# Patient Record
Sex: Male | Born: 1985 | Race: White | Hispanic: No | Marital: Married | Smoking: Never smoker
Health system: Southern US, Community
[De-identification: ages and names within clinical notes are randomized; demographics above are authoritative.]

## PROBLEM LIST (undated history)

## (undated) DIAGNOSIS — M549 Dorsalgia, unspecified: Secondary | ICD-10-CM

## (undated) DIAGNOSIS — S43006A Unspecified dislocation of unspecified shoulder joint, initial encounter: Secondary | ICD-10-CM

## (undated) DIAGNOSIS — F329 Major depressive disorder, single episode, unspecified: Secondary | ICD-10-CM

## (undated) DIAGNOSIS — F32A Depression, unspecified: Secondary | ICD-10-CM

## (undated) HISTORY — PX: TONSILLECTOMY: SUR1361

## (undated) HISTORY — DX: Depression, unspecified: F32.A

## (undated) HISTORY — PX: CLAVICLE SURGERY: SHX598

## (undated) HISTORY — DX: Major depressive disorder, single episode, unspecified: F32.9

## (undated) HISTORY — DX: Unspecified dislocation of unspecified shoulder joint, initial encounter: S43.006A

## (undated) HISTORY — DX: Dorsalgia, unspecified: M54.9

---

## 2013-10-14 ENCOUNTER — Emergency Department (HOSPITAL_COMMUNITY): Payer: 59

## 2013-10-14 ENCOUNTER — Emergency Department (HOSPITAL_COMMUNITY)
Admission: EM | Admit: 2013-10-14 | Discharge: 2013-10-14 | Disposition: A | Discharge status: Home / Self Care | Payer: 59 | Attending: Emergency Medicine | Admitting: Emergency Medicine

## 2013-10-14 ENCOUNTER — Encounter (HOSPITAL_COMMUNITY): Payer: Self-pay | Admitting: Emergency Medicine

## 2013-10-14 DIAGNOSIS — Y929 Unspecified place or not applicable: Secondary | ICD-10-CM | POA: Insufficient documentation

## 2013-10-14 DIAGNOSIS — Y9389 Activity, other specified: Secondary | ICD-10-CM | POA: Insufficient documentation

## 2013-10-14 DIAGNOSIS — IMO0002 Reserved for concepts with insufficient information to code with codable children: Secondary | ICD-10-CM | POA: Insufficient documentation

## 2013-10-14 DIAGNOSIS — S199XXA Unspecified injury of neck, initial encounter: Secondary | ICD-10-CM

## 2013-10-14 DIAGNOSIS — S0993XA Unspecified injury of face, initial encounter: Secondary | ICD-10-CM | POA: Insufficient documentation

## 2013-10-14 DIAGNOSIS — S301XXA Contusion of abdominal wall, initial encounter: Secondary | ICD-10-CM | POA: Insufficient documentation

## 2013-10-14 DIAGNOSIS — S0990XA Unspecified injury of head, initial encounter: Secondary | ICD-10-CM | POA: Insufficient documentation

## 2013-10-14 DIAGNOSIS — S43006A Unspecified dislocation of unspecified shoulder joint, initial encounter: Secondary | ICD-10-CM

## 2013-10-14 DIAGNOSIS — S20219A Contusion of unspecified front wall of thorax, initial encounter: Secondary | ICD-10-CM | POA: Insufficient documentation

## 2013-10-14 DIAGNOSIS — T07XXXA Unspecified multiple injuries, initial encounter: Secondary | ICD-10-CM

## 2013-10-14 MED ORDER — PROPOFOL 10 MG/ML IV BOLUS
100.0000 mg | Freq: Once | INTRAVENOUS | Status: DC
  Filled 2013-10-14: qty 20

## 2013-10-14 MED ORDER — KETAMINE HCL 10 MG/ML IJ SOLN
INTRAMUSCULAR | Status: AC | PRN
  Administered 2013-10-14: 10 mg via INTRAVENOUS
  Administered 2013-10-14: 1 mg via INTRAVENOUS
  Administered 2013-10-14 (×3): 10 mg via INTRAVENOUS

## 2013-10-14 MED ORDER — OXYCODONE-ACETAMINOPHEN 5-325 MG PO TABS: 1.0000 | ORAL_TABLET | ORAL | Status: DC | PRN

## 2013-10-14 MED ORDER — KETAMINE HCL 10 MG/ML IJ SOLN: 100.0000 mg | Freq: Once | INTRAMUSCULAR | Status: DC

## 2013-10-14 MED ORDER — IOHEXOL 300 MG/ML  SOLN
100.0000 mL | Freq: Once | INTRAMUSCULAR | Status: AC | PRN
  Administered 2013-10-14: 100 mL via INTRAVENOUS

## 2013-10-14 MED ORDER — PROPOFOL 10 MG/ML IV BOLUS
INTRAVENOUS | Status: AC | PRN
  Administered 2013-10-14: 1 mg via INTRAVENOUS

## 2013-10-14 MED ORDER — HYDROMORPHONE HCL PF 1 MG/ML IJ SOLN
1.0000 mg | Freq: Once | INTRAMUSCULAR | Status: AC | PRN
  Administered 2013-10-14: 1 mg via INTRAVENOUS
  Filled 2013-10-14: qty 1

## 2013-10-14 MED ORDER — METHOCARBAMOL 500 MG PO TABS: 500.0000 mg | ORAL_TABLET | Freq: Two times a day (BID) | ORAL | Status: DC

## 2013-10-14 MED ORDER — ONDANSETRON 8 MG PO TBDP: ORAL_TABLET | ORAL | Status: DC

## 2013-10-14 MED ORDER — ONDANSETRON HCL 4 MG/2ML IJ SOLN
4.0000 mg | Freq: Once | INTRAMUSCULAR | Status: AC
  Administered 2013-10-14: 4 mg via INTRAVENOUS
  Filled 2013-10-14: qty 2

## 2013-10-14 MED ORDER — HYDROMORPHONE HCL PF 1 MG/ML IJ SOLN
1.0000 mg | INTRAMUSCULAR | Status: DC | PRN
  Administered 2013-10-14 (×3): 1 mg via INTRAVENOUS
  Filled 2013-10-14 (×4): qty 1
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
## 2013-10-14 NOTE — ED Notes (Signed)
Patient transported to CT 

## 2013-10-14 NOTE — ED Notes (Signed)
Complains of pain in his back  And left shoulder, red abrasions on left and right anterior chest. Abrasion on left hand.no complains of head hurting.

## 2013-10-14 NOTE — Discharge Instructions (Signed)
1. Medications: percocet, zofran, usual home medications 2. Treatment: rest, drink plenty of fluids, wear sling 3. Follow Up: Please followup with Dr. Dion SaucierLandau for further evaluation and reassessment of your shoulder within 3 days.      Abrasion An abrasion is a cut or scrape of the skin. Abrasions do not extend through all layers of the skin and most heal within 10 days. It is important to care for your abrasion properly to prevent infection. CAUSES  Most abrasions are caused by falling on, or gliding across, the ground or other surface. When your skin rubs on something, the outer and inner layer of skin rubs off, causing an abrasion. DIAGNOSIS  Your caregiver will be able to diagnose an abrasion during a physical exam.  TREATMENT  Your treatment depends on how large and deep the abrasion is. Generally, your abrasion will be cleaned with water and a mild soap to remove any dirt or debris. An antibiotic ointment may be put over the abrasion to prevent an infection. A bandage (dressing) may be wrapped around the abrasion to keep it from getting dirty.  You may need a tetanus shot if:  You cannot remember when you had your last tetanus shot.  You have never had a tetanus shot.  The injury broke your skin. If you get a tetanus shot, your arm may swell, get red, and feel warm to the touch. This is common and not a problem. If you need a tetanus shot and you choose not to have one, there is a rare chance of getting tetanus. Sickness from tetanus can be serious.  HOME CARE INSTRUCTIONS   If a dressing was applied, change it at least once a day or as directed by your caregiver. If the bandage sticks, soak it off with warm water.   Wash the area with water and a mild soap to remove all the ointment 2 times a day. Rinse off the soap and pat the area dry with a clean towel.   Reapply any ointment as directed by your caregiver. This will help prevent infection and keep the bandage from sticking. Use  gauze over the wound and under the dressing to help keep the bandage from sticking.   Change your dressing right away if it becomes wet or dirty.   Only take over-the-counter or prescription medicines for pain, discomfort, or fever as directed by your caregiver.   Follow up with your caregiver within 24 48 hours for a wound check, or as directed. If you were not given a wound-check appointment, look closely at your abrasion for redness, swelling, or pus. These are signs of infection. SEEK IMMEDIATE MEDICAL CARE IF:   You have increasing pain in the wound.   You have redness, swelling, or tenderness around the wound.   You have pus coming from the wound.   You have a fever or persistent symptoms for more than 2 3 days.  You have a fever and your symptoms suddenly get worse.  You have a bad smell coming from the wound or dressing.  MAKE SURE YOU:   Understand these instructions.  Will watch your condition.  Will get help right away if you are not doing well or get worse. Document Released: 03/15/2005 Document Revised: 05/22/2012 Document Reviewed: 05/09/2011 [facility] Patient Information 2014 [place], [place].    Shoulder Dislocation  Shoulder dislocation is when your upper arm bone (humerus) is forced out of your shoulder joint. Your doctor will put your shoulder back into the joint by  pulling on your arm or through surgery. Your arm will be placed in a shoulder immobilizer or sling. The shoulder immobilizer or sling holds your shoulder in place while it heals. HOME CARE   Rest your injured joint. Do not move it until instructed to do so.  Put ice on your injured joint as told by your doctor.  Put ice in a plastic bag.  Place a towel between your skin and the bag.  Leave the ice on for 15-20 minutes at a time, every 2 hours while you are awake.  Only take medicines as told by your doctor.  Squeeze a ball to exercise your hand. GET HELP RIGHT AWAY IF:   Your  splint or sling becomes damaged.  Your pain becomes worse, not better.  You lose feeling in your arm or hand.  Your arm or hand becomes white or cold. MAKE SURE YOU:   Understand these instructions.  Will watch your condition.  Will get help right away if you are not doing well or get worse. Document Released: 08/28/2011 Document Reviewed: 08/28/2011 [facility] Patient Information 2014 [place], [place].

## 2013-10-14 NOTE — Sedation Documentation (Signed)
Patient alert and oriented and verbally. aldrete score
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
## 2013-10-14 NOTE — Sedation Documentation (Signed)
[place], Pharm D, wasted Propofol in [place].

## 2014-04-06 ENCOUNTER — Encounter: Payer: Self-pay | Admitting: Family Medicine

## 2014-04-06 ENCOUNTER — Ambulatory Visit (INDEPENDENT_AMBULATORY_CARE_PROVIDER_SITE_OTHER): Payer: Managed Care, Other (non HMO) | Admitting: Family Medicine

## 2014-04-06 VITALS — BP 102/80 | HR 66 | Temp 97.9°F | Ht 73.0 in | Wt 195.0 lb

## 2014-04-06 DIAGNOSIS — F32A Depression, unspecified: Secondary | ICD-10-CM

## 2014-04-06 DIAGNOSIS — F329 Major depressive disorder, single episode, unspecified: Secondary | ICD-10-CM

## 2014-04-06 MED ORDER — ESCITALOPRAM OXALATE 10 MG PO TABS: 10.0000 mg | ORAL_TABLET | Freq: Every day | ORAL | Status: DC

## 2014-04-06 NOTE — Progress Notes (Signed)
Pre visit review using our clinic review tool, if applicable. No additional management support is needed unless otherwise documented below in the visit note. 

## 2014-04-06 NOTE — Patient Instructions (Signed)
-  start the lexapro 10 mg daily  We recommend the following healthy lifestyle measures: - eat a healthy diet consisting of lots of vegetables, fruits, beans, nuts, seeds, healthy meats such as white chicken and fish and whole grains.  - avoid fried foods, fast food, processed foods, sodas, red meet and other fattening foods.  - get a least 150 minutes of aerobic exercise per week.   Follow up for physical and come fasting that day in 1 month

## 2014-04-06 NOTE — Progress Notes (Signed)
No chief complaint on file.   HPI:  John RamsayJason Campos is here to establish care. Has not seen a doctor in several years.  Has the following chronic problems and concerns today:  There are no active problems to display for this patient.  Depression: -on and off for 10 years -has wellbutrin, SSRIs in the past and they helped -hasn't been on any medications for some time -depressed mood all the time for > 6 months Depression Symptoms: Sleep disorder: sleep too much Interest deficit/anhedonia:  yes Guilt (worthlessness, hopelessness, regret): somewhat Energy deficit: yes Concentration deficit: yes Appetite disorder: no Psychomotor retardation or agitation: yes, sluggish Suicidality:  no Hx of SI in highschool, hospitalized, no attempts at suicide  Takes muscle tech - hydroxy  ROS negative for unless reported above: fevers, unintentional weight loss, hearing or vision loss, chest pain, palpitations, struggling to breath, hemoptysis, melena, hematochezia, hematuria, falls, loc, si, thoughts of self harm  Past Medical History  Diagnosis Date  . Depression   . Back pain   . Dislocated shoulder     Past Surgical History  Procedure Laterality Date  . Clavicle surgery    . Tonsillectomy      Family History  Problem Relation Age of Onset  . Mental illness Mother   . Cancer Maternal Grandmother   . Diabetes Maternal Grandfather     History   Social History  . Marital Status: Married    Spouse Name: N/A    Number of Children: N/A  . Years of Education: N/A   Social History Main Topics  . Smoking status: Never Smoker   . Smokeless tobacco: None  . Alcohol Use: No  . Drug Use: None  . Sexual Activity: None   Other Topics Concern  . None   Social History Narrative   Work or School: Designer, [employer] - product selection      Home Situation: lives with wife      Spiritual Beliefs: Christian      Lifestyle: very active at work, [employer] biking; diet healthy             Current outpatient prescriptions:escitalopram (LEXAPRO) 10 MG tablet, Take 1 tablet (10 mg total) by mouth daily., Disp: 30 tablet, Rfl: 3  EXAM:  Filed Vitals:   04/06/14 1444  BP: 102/80  Pulse: 66  Temp: 97.9 F (36.6 C)    Body mass index is 25.73 kg/(m^2).  GENERAL: vitals reviewed and listed above, alert, oriented, appears well hydrated and in no acute distress  HEENT: atraumatic, conjunttiva clear, no obvious abnormalities on inspection of external nose and ears  NECK: no obvious masses on inspection  LUNGS: clear to auscultation bilaterally, no wheezes, rales or rhonchi, good air movement  CV: HRRR, no peripheral edema  MS: moves all extremities without noticeable abnormality  PSYCH: pleasant and cooperative, no obvious depression or anxiety  ASSESSMENT AND PLAN:  Discussed the following assessment and plan:  Depression - Plan: escitalopram (LEXAPRO) 10 MG tablet  -discussed options and treatment and risks -We reviewed the PMH, PSH, FH, SH, Meds and Allergies. -We provided refills for any medications we will prescribe as needed. -We addressed current concerns per orders and patient instructions. -We have asked for records for pertinent exams, studies, vaccines and notes from previous providers. -We have advised patient to follow up per instructions below -in 1 month for CPE and follow up   -Patient advised to return or notify a doctor immediately if symptoms worsen or persist or new concerns  arise.  Patient Instructions  -start the lexapro 10 mg daily  We recommend the following healthy lifestyle measures: - eat a healthy diet consisting of lots of vegetables, fruits, beans, nuts, seeds, healthy meats such as white chicken and fish and whole grains.  - avoid fried foods, fast food, processed foods, sodas, red meet and other fattening foods.  - get a least 150 minutes of aerobic exercise per week.   Follow up for physical and come fasting that day in  1 month     John Campos R.

## 2014-04-30 ENCOUNTER — Ambulatory Visit (INDEPENDENT_AMBULATORY_CARE_PROVIDER_SITE_OTHER): Payer: Managed Care, Other (non HMO) | Admitting: Family Medicine

## 2014-04-30 ENCOUNTER — Encounter: Payer: Self-pay | Admitting: Family Medicine

## 2014-04-30 VITALS — BP 112/82 | HR 67 | Temp 98.0°F | Ht 72.5 in | Wt 187.6 lb

## 2014-04-30 DIAGNOSIS — Z0000 Encounter for general adult medical examination without abnormal findings: Secondary | ICD-10-CM

## 2014-04-30 DIAGNOSIS — M25522 Pain in left elbow: Secondary | ICD-10-CM

## 2014-04-30 DIAGNOSIS — F329 Major depressive disorder, single episode, unspecified: Secondary | ICD-10-CM

## 2014-04-30 DIAGNOSIS — M79622 Pain in left upper arm: Secondary | ICD-10-CM

## 2014-04-30 DIAGNOSIS — Z23 Encounter for immunization: Secondary | ICD-10-CM

## 2014-04-30 DIAGNOSIS — L989 Disorder of the skin and subcutaneous tissue, unspecified: Secondary | ICD-10-CM

## 2014-04-30 DIAGNOSIS — F32A Depression, unspecified: Secondary | ICD-10-CM

## 2014-04-30 LAB — BASIC METABOLIC PANEL
BUN: 18 mg/dL (ref 6–23)
CHLORIDE: 107 meq/L (ref 96–112)
CO2: 26 meq/L (ref 19–32)
CREATININE: 1 mg/dL (ref 0.4–1.5)
Calcium: 9.6 mg/dL (ref 8.4–10.5)
GFR: 98.99 mL/min (ref >60.00)
Glucose, Bld: 96 mg/dL (ref 70–99)
Potassium: 3.8 mEq/L (ref 3.5–5.1)
Sodium: 140 mEq/L (ref 135–145)

## 2014-04-30 LAB — LIPID PANEL
Cholesterol: 144 mg/dL (ref 0–200)
HDL: 66.2 mg/dL (ref >39.00)
LDL Cholesterol: 70 mg/dL (ref 0–99)
NonHDL: 77.8
Total CHOL/HDL Ratio: 2
Triglycerides: 39 mg/dL (ref 0.0–149.0)
VLDL: 7.8 mg/dL (ref 0.0–40.0)

## 2014-04-30 MED ORDER — ESCITALOPRAM OXALATE 10 MG PO TABS: 10.0000 mg | ORAL_TABLET | Freq: Every day | ORAL | Status: DC

## 2014-04-30 NOTE — Progress Notes (Signed)
Pre visit review using our clinic review tool, if applicable. No additional management support is needed unless otherwise documented below in the visit note. 

## 2014-04-30 NOTE — Addendum Note (Signed)
Addended by: Terressa KoyanagiKIM, Nayda Riesen R on: 04/30/2014 09:36 AM   Modules accepted: Orders

## 2014-04-30 NOTE — Addendum Note (Signed)
Addended by: Johnella MoloneyFUNDERBURK, Felita Bump A on: 04/30/2014 09:44 AM   Modules accepted: Orders

## 2014-04-30 NOTE — Patient Instructions (Signed)
BEFORE YOU LEAVE: -flu shot - have him lie down -labs - have him lie down  -We have ordered labs or studies at this visit. It can take up to 1-2 weeks for results and processing. We will contact you with instructions IF your results are abnormal. Normal results will be released to your [facility]. If you have not heard from us or can not find your results in [facility] in 2 weeks please contact our office.  -PLEASE SIGN UP FOR MYCHART TODAY   We recommend the following healthy lifestyle measures: - eat a healthy diet consisting of lots of vegetables, fruits, beans, nuts, seeds, healthy meats such as white chicken and fish and whole grains.  - avoid fried foods, fast food, processed foods, sodas, red meet and other fattening foods.  - get a least 150 minutes of aerobic exercise per week.

## 2014-04-30 NOTE — Progress Notes (Signed)
HPI:  Here for CPE:  -Concerns and/or follow up today:   Depression: -lexapro 10 mg started 03/2014 -reports: doing great -denies: SI, thoughts of self harm, irritability  L forearm pain: -started 1 month ago after hitting ulnar side of arm -felt better for awhile and then hurt yesterday, ok today -denies:weakness, numbness  -Diet: variety of foods, balanced  -Exercise: regular exercise - very active at work, usually bikes 2-3 times per week too  -Diabetes and Dyslipidemia Screening: doing today - FASTING  -Hx of HTN: no  -Vaccines: flu offered  -sexual activity: yes, male partner, no new partners  -wants STI testing, Hep C screening (if born [id number]): no  -FH colon or prstate ca: see FH Last colon cancer screening: n/a Last prostate ca screening:n/a  -Alcohol, Tobacco, drug use: see social history  Review of Systems - no fevers, unintentional weight loss, vision loss, hearing loss, chest pain, sob, hemoptysis, melena, hematochezia, hematuria, genital discharge, changing or concerning skin lesions, bleeding, bruising, loc, thoughts of self harm or SI  Past Medical History  Diagnosis Date  . Depression   . Back pain   . Dislocated shoulder     Past Surgical History  Procedure Laterality Date  . Clavicle surgery    . Tonsillectomy      Family History  Problem Relation Age of Onset  . Mental illness Mother   . Cancer Maternal Grandmother   . Diabetes Maternal Grandfather     History   Social History  . Marital Status: Married    Spouse Name: N/A    Number of Children: N/A  . Years of Education: N/A   Social History Main Topics  . Smoking status: Never Smoker   . Smokeless tobacco: None  . Alcohol Use: No  . Drug Use: None  . Sexual Activity: None   Other Topics Concern  . None   Social History Narrative   Work or School: Designer, [employer] - product selection      Home Situation: lives with wife      Spiritual Beliefs: Christian      Lifestyle: very active at work, [employer] biking; diet healthy             Current outpatient prescriptions: escitalopram (LEXAPRO) 10 MG tablet, Take 1 tablet (10 mg total) by mouth daily., Disp: 90 tablet, Rfl: 1;  Linoleic Acid-Sunflower Oil (CLA PO), Take by mouth., Disp: , Rfl: ;  Misc Natural Products (ENERGY SUPPORT PO), Take by mouth., Disp: , Rfl: ;  Multiple Vitamin (MULTIVITAMIN) capsule, Take 1 capsule by mouth daily., Disp: , Rfl:   EXAM:  Filed Vitals:   04/30/14 0903  BP: 112/82  Pulse: 67  Temp: 98 F (36.7 C)  TempSrc: Oral  Height: 6' 0.5" (1.842 m)  Weight: 187 lb 9.6 oz (85.095 kg)    Estimated body mass index is 25.08 kg/(m^2) as calculated from the following:   Height as of this encounter: 6' 0.5" (1.842 m).   Weight as of this encounter: 187 lb 9.6 oz (85.095 kg).  GENERAL: vitals reviewed and listed below, alert, oriented, appears well hydrated and in no acute distress  HEENT: head atraumatic, PERRLA, normal appearance of eyes, ears, nose and mouth. moist mucus membranes.  NECK: supple, no masses or lymphadenopathy  LUNGS: clear to auscultation bilaterally, no rales, rhonchi or wheeze  CV: HRRR, no peripheral edema or cyanosis, normal pedal pulses  ABDOMEN: bowel sounds normal, soft, non tender to palpation, no masses, no rebound or guarding  GU: declined  RECTAL: refused  SKIN: no rash or abnormal lesions, slightly raised light reddish brown stuck on mole approx [id number]  MS: normal gait, moves all extremities normally, normal exam of forearms except for mild TTP in soft tissue L forearm focal  NEURO: CN II-XII grossly intact, normal muscle strength and sensation to light touch on extremities  PSYCH: normal affect, pleasant and cooperative  ASSESSMENT AND PLAN:  Discussed the following assessment and plan:  There are no diagnoses linked to this encounter  Visit for preventive health examination -Discussed and advised all [place] preventive  services health task force level A and B recommendations for age, sex and risks.  -Advised at least 150 minutes of exercise per week and a healthy diet low in saturated fats and sweets and consisting of fresh fruits and vegetables, lean meats such as fish and white chicken and whole grains.  -FASTING labs, studies and vaccines per orders this encounter  Depression - Plan: escitalopram (LEXAPRO) 10 MG tablet -continue lexapro, doing great -follow up 3 months -discussed continuing medication for 6-12 months before trial off  Pain in joint, upper arm, left -normal exam, advised to call if persists and will get plain film  Skin lesion -likely SK, discussed low potential for other or serious etiology, but that only way to know for sure is biopsy and offered this, he thinks this is stable and declined  Patient advised to return to clinic immediately if symptoms worsen or persist or new concerns.  Patient Instructions  BEFORE YOU LEAVE: -flu shot - have him lie down -labs - have him lie down  -We have ordered labs or studies at this visit. It can take up to 1-2 weeks for results and processing. We will contact you with instructions IF your results are abnormal. Normal results will be released to your [facility]. If you have not heard from us or can not find your results in [facility] in 2 weeks please contact our office.  -PLEASE SIGN UP FOR MYCHART TODAY   We recommend the following healthy lifestyle measures: - eat a healthy diet consisting of lots of vegetables, fruits, beans, nuts, seeds, healthy meats such as white chicken and fish and whole grains.  - avoid fried foods, fast food, processed foods, sodas, red meet and other fattening foods.  - get a least 150 minutes of aerobic exercise per week.        Return in about 3 months (around 07/31/2014), or if symptoms worsen or fail to improve.   Kriste BasqueKIM, HANNAH R.

## 2014-08-08 ENCOUNTER — Emergency Department (INDEPENDENT_AMBULATORY_CARE_PROVIDER_SITE_OTHER): Payer: Managed Care, Other (non HMO)

## 2014-08-08 ENCOUNTER — Emergency Department (HOSPITAL_COMMUNITY)
Admission: EM | Admit: 2014-08-08 | Discharge: 2014-08-08 | Disposition: A | Discharge status: Home / Self Care | Payer: Managed Care, Other (non HMO) | Source: Home / Self Care

## 2014-08-08 ENCOUNTER — Encounter (HOSPITAL_COMMUNITY): Payer: Self-pay | Admitting: *Deleted

## 2014-08-08 DIAGNOSIS — S93401A Sprain of unspecified ligament of right ankle, initial encounter: Secondary | ICD-10-CM

## 2014-08-08 DIAGNOSIS — M25571 Pain in right ankle and joints of right foot: Secondary | ICD-10-CM

## 2014-08-08 MED ORDER — MELOXICAM 15 MG PO TABS
ORAL_TABLET | ORAL | Status: DC
Start: 2014-08-08 — End: 2014-10-01

## 2014-08-08 NOTE — ED Notes (Signed)
Pt  States   She  Injured  his  r  Ankle  2  Days  Ago    When  He  Became  Angry    And  Kicked  A  Crate   -  He  Has  Pain    On  Certain movements  And  posistions          He  Is  Able  To  Bear  Weight         He    Also states  He      Hit his  Head   On  A  Steel  Beam  He  Did not  [employer]  Out      -    He  Is  Awake  And  Alert  And  Oriented         He   Has  Not  Vomited

## 2014-08-08 NOTE — ED Notes (Signed)
Large    aso  Applied  r  Ankle

## 2014-08-08 NOTE — Discharge Instructions (Signed)
Ankle Sprain °An ankle sprain is an injury to the strong, fibrous tissues (ligaments) that hold the bones of your ankle joint together.  °CAUSES °An ankle sprain is usually caused by a fall or by twisting your ankle. Ankle sprains most commonly occur when you step on the outer edge of your foot, and your ankle turns inward. People who participate in sports are more prone to these types of injuries.  °SYMPTOMS  °· Pain in your ankle. The pain may be present at rest or only when you are trying to stand or walk. °· Swelling. °· Bruising. Bruising may develop immediately or within 1 to 2 days after your injury. °· Difficulty standing or walking, particularly when turning corners or changing directions. °DIAGNOSIS  °Your caregiver will ask you details about your injury and perform a physical exam of your ankle to determine if you have an ankle sprain. During the physical exam, your caregiver will press on and apply pressure to specific areas of your foot and ankle. Your caregiver will try to move your ankle in certain ways. An X-ray exam may be done to be sure a bone was not broken or a ligament did not separate from one of the bones in your ankle (avulsion fracture).  °TREATMENT  °Certain types of braces can help stabilize your ankle. Your caregiver can make a recommendation for this. Your caregiver may recommend the use of medicine for pain. If your sprain is severe, your caregiver may refer you to a surgeon who helps to restore function to parts of your skeletal system (orthopedist) or a physical therapist. °HOME CARE INSTRUCTIONS  °· Apply ice to your injury for 1-2 days or as directed by your caregiver. Applying ice helps to reduce inflammation and pain. °¨ Put ice in a plastic bag. °¨ Place a towel between your skin and the bag. °¨ Leave the ice on for 15-20 minutes at a time, every 2 hours while you are awake. °· Only take over-the-counter or prescription medicines for pain, discomfort, or fever as directed by  your caregiver. °· Elevate your injured ankle above the level of your heart as much as possible for 2-3 days. °· If your caregiver recommends crutches, use them as instructed. Gradually put weight on the affected ankle. Continue to use crutches or a cane until you can walk without feeling pain in your ankle. °· If you have a plaster splint, wear the splint as directed by your caregiver. Do not rest it on anything harder than a pillow for the first 24 hours. Do not put weight on it. Do not get it wet. You may take it off to take a shower or bath. °· You may have been given an elastic bandage to wear around your ankle to provide support. If the elastic bandage is too tight (you have numbness or tingling in your foot or your foot becomes cold and blue), adjust the bandage to make it comfortable. °· If you have an air splint, you may blow more air into it or let air out to make it more comfortable. You may take your splint off at night and before taking a shower or bath. Wiggle your toes in the splint several times per day to decrease swelling. °SEEK MEDICAL CARE IF:  °· You have rapidly increasing bruising or swelling. °· Your toes feel extremely cold or you lose feeling in your foot. °· Your pain is not relieved with medicine. °SEEK IMMEDIATE MEDICAL CARE IF: °· Your toes are numb or blue. °·   You have severe pain that is increasing. MAKE SURE YOU:   Understand these instructions.  Will watch your condition.  Will get help right away if you are not doing well or get worse. Document Released: 06/05/2005 Document Revised: 02/28/2012 Document Reviewed: 06/17/2011 [facility] Patient Information 2015 [place]. This information is not intended to replace advice given to you by your health care provider. Make sure you discuss any questions you have with your health care provider.   Ice for 20min 3x a day with rest. F/U with ORtho if worsens. Info given.

## 2014-08-08 NOTE — ED Provider Notes (Signed)
CSN: [id number]     Arrival date & time 08/08/14  1450 History   None    Chief Complaint  Patient presents with  . Ankle Pain   (Consider location/radiation/quality/duration/timing/severity/associated sxs/prior Treatment) HPI Comments: [name] presents with right ankle pain x 2 days ago. He kicked a box of produce with lower leg and ankle; foot was flexed. He had immediate pain and "stiffness" in the ankle. No swelling. Pain with ambulation. Pain is mostly laterally at times radiates to distal lower leg.  Concerned re: fracture. Not using anything for pain or discomfort.   Patient is a 29 y.o. male presenting with ankle pain. The history is provided by the patient.  Ankle Pain   Past Medical History  Diagnosis Date  . Depression   . Back pain   . Dislocated shoulder    Past Surgical History  Procedure Laterality Date  . Clavicle surgery    . Tonsillectomy     Family History  Problem Relation Age of Onset  . Mental illness Mother   . Cancer Maternal Grandmother   . Diabetes Maternal Grandfather    History  Substance Use Topics  . Smoking status: Never Smoker   . Smokeless tobacco: Not on file  . Alcohol Use: No    Review of Systems  All other systems reviewed and are negative.   Allergies  Codeine  Home Medications   Prior to Admission medications   Medication Sig Start Date End Date Taking? Authorizing Provider  escitalopram (LEXAPRO) 10 MG tablet Take 1 tablet (10 mg total) by mouth daily. 04/30/14   Terressa KoyanagiHannah R Kim, DO  Linoleic Acid-Sunflower Oil (CLA PO) Take by mouth.    Historical Provider, MD  meloxicam (MOBIC) 15 MG tablet 1 tablet po q day for 1-2 weeks for ankle pain/sprain-take with food 08/08/14   Riki SheerMichelle G Tatem Fesler, PA-C  Misc Natural Products (ENERGY SUPPORT PO) Take by mouth.    Historical Provider, MD  Multiple Vitamin (MULTIVITAMIN) capsule Take 1 capsule by mouth daily.    Historical Provider, MD   BP 124/70 mmHg  Pulse 77  Temp(Src) 98.7 F  (37.1 C) (Oral)  Resp 14  SpO2 100% Physical Exam  Constitutional: He is oriented to person, place, and time. He appears well-developed and well-nourished. No distress.  HENT:  Head: Normocephalic and atraumatic.  Musculoskeletal: He exhibits tenderness. He exhibits no edema.  Right lower extremity without deformities or ecchymosis. No swelling is noted. Full ROM with pain upon flexion of the ankle and with lateral rotation. No foot swelling or pain. Good strength in the ankle joint  Neurological: He is alert and oriented to person, place, and time.  Skin: Skin is warm and dry. He is not diaphoretic. No erythema.  Psychiatric: His behavior is normal.  Nursing note and vitals reviewed.   ED Course  Procedures (including critical care time) Labs Review Labs Reviewed - No data to display  Imaging Review Dg Ankle Complete Right  08/08/2014   CLINICAL DATA:  Left ankle pain post injury 2 days ago, kicked box  EXAM: RIGHT ANKLE - COMPLETE 3+ VIEW  COMPARISON:  None.  FINDINGS: Three views of the right ankle submitted. No acute fracture or subluxation. No radiopaque foreign body. Ankle mortise is preserved.  IMPRESSION: Negative.   Electronically Signed   By: Natasha MeadLiviu  Pop M.D.   On: 08/08/2014 17:04     MDM   1. Ankle sprain, right, initial encounter   2. Ankle pain, right  Grade 1 Sprain. Xrays normal. Treat symptomatically with ankle ASO, rest, Ice, NSAID's. Work note given. F/U with Ortho if symptoms persist. Information given.     Riki Sheer, PA-C 08/08/14 [phone]

## 2014-09-18 ENCOUNTER — Ambulatory Visit (INDEPENDENT_AMBULATORY_CARE_PROVIDER_SITE_OTHER): Payer: Managed Care, Other (non HMO) | Admitting: Family Medicine

## 2014-09-18 ENCOUNTER — Encounter: Payer: Self-pay | Admitting: *Deleted

## 2014-09-18 ENCOUNTER — Encounter: Payer: Self-pay | Admitting: Family Medicine

## 2014-09-18 VITALS — BP 120/78 | HR 82 | Temp 98.0°F | Ht 72.5 in | Wt 190.6 lb

## 2014-09-18 DIAGNOSIS — R0982 Postnasal drip: Secondary | ICD-10-CM | POA: Diagnosis not present

## 2014-09-18 DIAGNOSIS — F329 Major depressive disorder, single episode, unspecified: Secondary | ICD-10-CM | POA: Diagnosis not present

## 2014-09-18 DIAGNOSIS — J069 Acute upper respiratory infection, unspecified: Secondary | ICD-10-CM | POA: Diagnosis not present

## 2014-09-18 DIAGNOSIS — F32A Depression, unspecified: Secondary | ICD-10-CM

## 2014-09-18 MED ORDER — FLUTICASONE PROPIONATE 50 MCG/ACT NA SUSP: 2.0000 | Freq: Every day | NASAL | Status: DC

## 2014-09-18 MED ORDER — ESCITALOPRAM OXALATE 10 MG PO TABS: 10.0000 mg | ORAL_TABLET | Freq: Every day | ORAL | Status: DC

## 2014-09-18 NOTE — Patient Instructions (Addendum)
BEFORE YOU LEAVE: -schedule follow up in 1 month -work not for yesterday and today, return to work tomorrow  Chronic nasal congestion: -start claritin (does not make you tired) or zyrtec (can make you tired - so take at night)once daily every day -flonase 2 sprays each nostril daily for 1 month

## 2014-09-18 NOTE — Progress Notes (Signed)
Pre visit review using our clinic review tool, if applicable. No additional management support is needed unless otherwise documented below in the visit note. 

## 2014-09-18 NOTE — Progress Notes (Signed)
HPI:  URI: -started: 3 days ago -symptoms:nasal congestion, sore throat, cough, laryngitis -denies:fever, SOB, NVD, tooth pain -has tried: musinex d and sudafed -sick contacts/travel/risks: denies flu exposure, tick exposure or or Ebola risks  Depression: -reports he is doing well on the lexapro -mild irritable mood at work at times -denies: SI, thoughts of self harm  ROS: See pertinent positives and negatives per HPI.  Past Medical History  Diagnosis Date  . Depression   . Back pain   . Dislocated shoulder     Past Surgical History  Procedure Laterality Date  . Clavicle surgery    . Tonsillectomy      Family History  Problem Relation Age of Onset  . Mental illness Mother   . Cancer Maternal Grandmother   . Diabetes Maternal Grandfather     History   Social History  . Marital Status: Married    Spouse Name: N/A  . Number of Children: N/A  . Years of Education: N/A   Social History Main Topics  . Smoking status: Never Smoker   . Smokeless tobacco: Not on file  . Alcohol Use: No  . Drug Use: Not on file  . Sexual Activity: Not on file   Other Topics Concern  . 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  escitalopram (LEXAPRO) 10 MG tablet, Take 1 tablet (10 mg total) by mouth daily., Disp: 90 tablet, Rfl: 1 .  Linoleic Acid-Sunflower Oil (CLA PO), Take by mouth., Disp: , Rfl:  .  meloxicam (MOBIC) 15 MG tablet, 1 tablet po q day for 1-2 weeks for ankle pain/sprain-take with food, Disp: 30 tablet, Rfl: 0 .  Misc Natural Products (ENERGY SUPPORT PO), Take by mouth., Disp: , Rfl:  .  Multiple Vitamin (MULTIVITAMIN) capsule, Take 1 capsule by mouth daily., Disp: , Rfl:  .  fluticasone (FLONASE) 50 MCG/ACT nasal spray,  Place 2 sprays into both nostrils daily., Disp: 16 g, Rfl: 6  EXAM:  Filed Vitals:   09/18/14 1556  BP: 120/78  Pulse: 82  Temp: 98 F (36.7 C)    Body mass index is 25.48 kg/(m^2).  GENERAL: vitals reviewed and listed above, alert, oriented, appears well hydrated and in no acute distress  HEENT: atraumatic, conjunttiva clear, no obvious abnormalities on inspection of external nose and ears, normal appearance of ear canals and TMs, clear nasal congestion, mild post oropharyngeal erythema with PND, no tonsillar edema or exudate, no sinus TTP  NECK: no obvious masses on inspection  LUNGS: clear to auscultation bilaterally, no wheezes, rales or rhonchi, good air movement  CV: HRRR, no peripheral edema  MS: moves all extremities without noticeable abnormality  PSYCH: pleasant and cooperative, no obvious depression or anxiety  ASSESSMENT AND PLAN:  Discussed the following assessment and plan:  PND (post-nasal drip) - Plan: fluticasone (FLONASE) 50 MCG/ACT nasal spray  Acute upper respiratory infection  Depression - Plan: escitalopram (LEXAPRO) 10 MG tablet  -given HPI and exam findings today, a serious infection or illness is unlikely. We discussed potential etiologies, with VURI being most likely, and advised supportive care and monitoring. We discussed treatment side effects, likely course, antibiotic misuse, transmission,  and signs of developing a serious illness. -discussed causes of PND at length - likely allergies, advised trial of flonase and antihistamine with allergy or ENT eval if persists. -of course, we advised to return or notify a doctor immediately if symptoms worsen or persist or new concerns arise.    Patient Instructions  BEFORE YOU LEAVE: -schedule follow up in 1 month -work not for yesterday and today, return to work tomorrow  Chronic nasal congestion: -start claritin (does not make you tired) or zyrtec (can make you tired - so take at night)once daily  every day -flonase 2 sprays each nostril daily for 1 month       John Campos, John R.

## 2014-10-01 ENCOUNTER — Emergency Department (HOSPITAL_COMMUNITY)
Admission: EM | Admit: 2014-10-01 | Discharge: 2014-10-01 | Disposition: A | Discharge status: Home / Self Care | Payer: Managed Care, Other (non HMO) | Source: Home / Self Care | Attending: Family Medicine | Admitting: Family Medicine

## 2014-10-01 ENCOUNTER — Emergency Department (INDEPENDENT_AMBULATORY_CARE_PROVIDER_SITE_OTHER): Payer: Managed Care, Other (non HMO)

## 2014-10-01 ENCOUNTER — Encounter (HOSPITAL_COMMUNITY): Payer: Self-pay | Admitting: Emergency Medicine

## 2014-10-01 DIAGNOSIS — M2021 Hallux rigidus, right foot: Secondary | ICD-10-CM | POA: Diagnosis not present

## 2014-10-01 DIAGNOSIS — S93529A Sprain of metatarsophalangeal joint of unspecified toe(s), initial encounter: Secondary | ICD-10-CM

## 2014-10-01 MED ORDER — MELOXICAM 15 MG PO TABS: ORAL_TABLET | ORAL | Status: DC

## 2014-10-01 NOTE — ED Notes (Signed)
Right great toe pain, onset 3 days ago

## 2014-10-01 NOTE — Discharge Instructions (Signed)
Thank you for coming in today.   Hallux Rigidus Hallux rigidus is a condition involving pain and a loss of motion of the first (big) toe. The pain gets worse with lifting up (extension) of the toe. This is usually due to arthritic bony bumps (spurring) of the joint at the base of the big toe.  SYMPTOMS   Pain, with lifting up of the toe.  Tenderness over the joint where the big toe meets the foot.  Redness, swelling, and warmth over the top of the base of the big toe (sometimes).  Foot pain, stiffness, and limping. CAUSES  Hallux rigidus is caused by arthritis of the joint where the big toe meets the foot. The arthritis creates a bone spur that pinches the soft tissues when the toe is extended. RISK INCREASES WITH:  Tight shoes with a narrow toe box.  Family history of foot problems.  Gout and rheumatoid and psoriatic arthritis.  History of previous toe injury, including "turf toe."  Long first toe, flat feet, and other big toe bony bumps.  Arthritis of the big toe. PREVENTION   Wear wide-toed shoes that fit well.  Tape the big toe to reduce motion and to prevent pinching of the tissues between the bone.  Maintain physical fitness:  Foot and ankle flexibility.  Muscle strength and endurance. PROGNOSIS  This condition can usually be managed with proper treatment. However, surgery is typically required to prevent the problem from recurring.  RELATED COMPLICATIONS  Injury to other areas of the foot or ankle, caused by abnormal walking in an attempt to avoid the pain felt when walking normally. TREATMENT Treatment first involves stopping the activities that aggravate your symptoms. Ice and medicine can be used to reduce the pain and inflammation. Modifications to shoes may help reduce pain, including wearing stiff-soled shoes, shoes with a wide toe box, inserting a padded donut to relieve pressure on top of the joint, or wearing an arch support. Corticosteroid injections may  be given to reduce inflammation. If nonsurgical treatment is unsuccessful, surgery may be needed. Surgical options include removing the arthritic bony spur, cutting a bone in the foot to change the arc of motion (allowing the toe to extend more), or fusion of the joint (eliminating all motion in the joint at the base of the big toe).  MEDICATION   If pain medicine is needed, nonsteroidal anti-inflammatory medicines (aspirin and ibuprofen), or other minor pain relievers (acetaminophen), are often advised.  Do not take pain medicine for 7 days before surgery.  Prescription pain relievers are usually prescribed only after surgery. Use only as directed and only as much as you need.  Ointments for arthritis, applied to the skin, may give some relief.  Injections of corticosteroids may be given to reduce inflammation. HEAT AND COLD  Cold treatment (icing) relieves pain and reduces inflammation. Cold treatment should be applied for 10 to 15 minutes every 2 to 3 hours, and immediately after activity that aggravates your symptoms. Use ice packs or an ice massage.  Heat treatment may be used before performing the stretching and strengthening activities prescribed by your caregiver, physical therapist, or athletic trainer. Use a heat pack or a warm water soak. SEEK MEDICAL CARE IF:   Symptoms get worse or do not improve in 2 weeks, despite treatment.  After surgery you develop fever, increasing pain, redness, swelling, drainage of fluids, bleeding, or increasing warmth.  New, unexplained symptoms develop. (Drugs used in treatment may produce side effects.) Document Released: 06/05/2005 Document  Revised: 10/20/2013 Document Reviewed: 09/17/2008 ExitCare Patient Information 2015 [place], [place]. This information is not intended to replace advice given to you by your health care provider. Make sure you discuss any questions you have with your health care provider.  Turf Toe, with Rehab Injury to the  base of the big toe (first metatarsal phalangeal joint) that causes damage to the joint capsule and ligaments is known as turf toe. Turf toe commonly occurs on the bottom side of the joint. SYMPTOMS   Pain, tenderness, inflammation and/or bruising around the big toe (contusion).  Pain that worsens with movement of the big toe, specifically when raising (extending) the toe.  Inability to walk properly on the affected foot, which causes one to limp. CAUSES  Turf toe is caused by a force being placed on the joint capsule and ligaments that is greater than they can withstand. Common mechanisms of injury include:  Repetitive and/or strenuous extension of the big toe (standing on tiptoes).  Explosive running starts (sprinters).  "Stubbing" the big toe.  Another player landing on your foot. RISK INCREASES WITH:  Previous toe injury.  Having a long first toe.  Flat feet.  Arthritis of the great toe.  Improperly fitted shoes or shoes that are not appropriate for a given activity.  Family history of foot abnormalities.  Activities that involve explosive running starts, standing on tiptoes, or jumping. PREVENTION  Wear properly fitted shoes that are appropriate for the sport or activity.  Protect the first toe by taping it to reduce motion.  Maintain physical fitness:  Strength, flexibility, and endurance.  Cardiovascular fitness. PROGNOSIS  If treated properly, the symptoms of turf toe usually resolve with non-surgical (conservative) treatment. Occasionally, surgery is necessary. RELATED COMPLICATIONS  Recurrent symptoms that result in a chronic problem.  Inability to compete in athletics.  Prolonged healing time, if improperly treated or re-injured.  Other foot injuries that occur due to protecting the first toe from pain.  Loss of motion in the first toe (hallux rigidus).  Bunion (hallux valgus). TREATMENT  Treatment initially involves resting from any activities  that aggravate the symptoms, and the use of ice and medications to help reduce pain and inflammation. The use of range-of-motion exercises may help reduce pain with activity. It is important that you wear properly fitted shoes with a stiff sole and a wide toe box, in order to reduce the pressure on the first toe. Protecting your big toe by taping it to restrict movement may allow you to return to sports earlier without pain or discomfort. If the condition becomes chronic, then your caregiver may recommend a corticosteroid injection to help reduce inflammation. If symptoms persist despite non-surgical treatment, then surgery may be recommended. MEDICATION  If pain medication is necessary, then nonsteroidal anti-inflammatory medications, such as aspirin and ibuprofen, or other minor pain relievers, such as acetaminophen, are often recommended.  Do not take pain medication for 7 days before surgery.  Prescription pain relievers may be given if deemed necessary by your caregiver. Use only as directed and only as much as you need.  Ointments applied to the skin may be helpful.  Corticosteroid injections may be given by your caregiver. These injections should be reserved for the most serious cases, because they may only be given a certain number of times. HEAT AND COLD  Cold treatment (icing) relieves pain and reduces inflammation. Cold treatment should be applied for 10 to 15 minutes every 2 to 3 hours for inflammation and pain and immediately after  any activity that aggravates your symptoms. Use ice packs or massage the area with a piece of ice (ice massage).  Heat treatment may be used prior to performing the stretching and strengthening activities prescribed by your caregiver, physical therapist, or athletic trainer. Use a heat pack or soak the injury in warm water. SEEK MEDICAL CARE IF:  Treatment seems to offer no benefit, or the condition worsens.  Any medications produce adverse side  effects. EXERCISES RANGE OF MOTION (ROM) AND STRETCHING EXERCISES - Turf Toe These exercises may help you when beginning to rehabilitate your injury. Your symptoms may resolve with or without further involvement from your physician, physical therapist, or athletic trainer. While completing these exercises, remember:  Restoring tissue flexibility helps normal motion to return to the joints. This allows healthier, less painful movement and activity.  An effective stretch should be held for at least 30 seconds.  A stretch should never be painful. You should only feel a gentle lengthening or release in the stretched tissue. RANGE OF MOTION - Toe Extension, Flexion  Sit with your right / left leg crossed over your opposite knee.  Grasp your toes and gently pull them back toward the top of your foot. You should feel a stretch on the bottom of your toes and/or foot.  Hold this stretch for __________ seconds.  Now, gently pull your toes toward the bottom of your foot. You should feel a stretch on the top of your toes and or foot.  Hold this stretch for __________ seconds. Repeat __________ times. Complete this stretch __________ times per day. RANGE OF MOTION - Ankle Plantar Flexion  Sit with your right / left leg crossed over your opposite knee.  Use your opposite hand to pull the top of your foot and toes toward you.  You should feel a gentle stretch on the top of your foot/ankle. Hold this position for __________ seconds. Repeat __________ times. Complete __________ times per day. STRENGTHENING EXERCISES - Turf Toe These exercises may help you when beginning to rehabilitate your injury. They may resolve your symptoms with or without further involvement from your physician, physical therapist, or athletic trainer. While completing these exercises, remember:  Muscles can gain both the endurance and the strength needed for everyday activities through controlled exercises.  Complete these  exercises as instructed by your physician, physical therapist, or athletic trainer. Progress with the resistance and repetition exercises only as your caregiver advises.  You may experience muscle soreness or fatigue, but the pain or discomfort you are trying to eliminate should never worsen during these exercises. If this pain does worsen, stop and make certain you are following the directions exactly. If the pain is still present after adjustments, discontinue the exercise until you can discuss the trouble with your clinician. STRENGTH - Towel Curls  Sit in a chair positioned on a non-carpeted surface.  Place your foot on a towel, keeping your heel on the floor.  Pull the towel toward your heel by only curling your toes. Keep your heel on the floor.  If instructed by your physician, physical therapist or athletic trainer, add ____________________ at the end of the towel. Repeat __________ times. Complete this exercise __________ times per day. Document Released: 06/05/2005 Document Revised: 10/20/2013 Document Reviewed: 09/17/2008 [facility] Patient Information 2015 [place], [place]. This information is not intended to replace advice given to you by your health care provider. Make sure you discuss any questions you have with your health care provider.

## 2014-10-01 NOTE — ED Provider Notes (Signed)
John RamsayJason Campos is a 29 y.o. male who presents to Urgent Care today for right great toe pain. Patient has a history of chronic right toe irritation. Fundamentally he has decreased range of motion of his right toe causes some discomfort. He was doing pre-well in his normal state of health until 2 days ago when he slipped and fell at work injuring his right toe. He notes pain along the lateral aspect of his right first MTP. He's tried some Motrin which have helped a little for pain.    Past Medical History  Diagnosis Date  . Depression   . Back pain   . Dislocated shoulder    Past Surgical History  Procedure Laterality Date  . Clavicle surgery    . Tonsillectomy     History  Substance Use Topics  . Smoking status: Never Smoker   . Smokeless tobacco: Not on file  . Alcohol Use: No   ROS as above Medications: No current facility-administered medications for this encounter.   Current Outpatient Prescriptions  Medication Sig Dispense Refill  . escitalopram (LEXAPRO) 10 MG tablet Take 1 tablet (10 mg total) by mouth daily. 90 tablet 1  . fluticasone (FLONASE) 50 MCG/ACT nasal spray Place 2 sprays into both nostrils daily. 16 g 6  . Linoleic Acid-Sunflower Oil (CLA PO) Take by mouth.    . meloxicam (MOBIC) 15 MG tablet 1 tablet po q day for 1-2 weeks for ankle pain/sprain-take with food 30 tablet 0  . Misc Natural Products (ENERGY SUPPORT PO) Take by mouth.    . Multiple Vitamin (MULTIVITAMIN) capsule Take 1 capsule by mouth daily.     Allergies  Allergen Reactions  . Codeine Rash     Exam:  BP 124/68 mmHg  Pulse 62  Temp(Src) 97.9 F (36.6 C) (Oral)  Resp 16  SpO2 98% Gen: Well NAD Right great toe decreased flexion and extension range of motion compared to left. Mildly tender lateral aspect of the MTP. Pulses capillary refill and sensation intact foot.  No results found for this or any previous visit (from the past 24 hour(s)). Dg Foot Complete Right  10/01/2014   CLINICAL  DATA:  First MTP pain in injury.  EXAM: RIGHT FOOT COMPLETE - 3+ VIEW  COMPARISON:  None.  FINDINGS: No acute fracture or dislocation.  No acute soft tissue findings.  First MTP osteoarthritis with joint narrowing, marginal spurring and chronic medial fragmentation/ossification. No erosive changes.  IMPRESSION: 1. No acute osseous findings. 2. First MTP osteoarthritis.   Electronically Signed   By: Marnee SpringJonathon  Watts M.D.   On: 10/01/2014 21:01    Assessment and Plan: 29 y.o. male with  1) turf toe injury at work. Treat with low back and steel insert as needed. 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 Follow up with sports medicine for consideration of orthotics  Discussed warning signs or symptoms. Please see discharge instructions. Patient expresses understanding.     Rodolph BongEvan S Yoselin Amerman, MD 10/01/14 2110

## 2014-10-07 NOTE — ED Notes (Signed)
Patient called, asking for limited work note . Provided note x 1 week w advise to make an appointment to see ortho next week for recheck

## 2014-10-19 ENCOUNTER — Ambulatory Visit (INDEPENDENT_AMBULATORY_CARE_PROVIDER_SITE_OTHER): Payer: Managed Care, Other (non HMO) | Admitting: Family Medicine

## 2014-10-19 ENCOUNTER — Encounter: Payer: Self-pay | Admitting: Family Medicine

## 2014-10-19 VITALS — BP 118/80 | HR 60 | Temp 98.3°F | Ht 72.5 in | Wt 202.1 lb

## 2014-10-19 DIAGNOSIS — J329 Chronic sinusitis, unspecified: Secondary | ICD-10-CM | POA: Diagnosis not present

## 2014-10-19 NOTE — Progress Notes (Signed)
Pre visit review using our clinic review tool, if applicable. No additional management support is needed unless otherwise documented below in the visit note. 

## 2014-10-19 NOTE — Progress Notes (Signed)
  HPI:  Follow up:  PND: -trail antihistamine and flonase started 1 month ago -reports: he changed jobs and is doing much better -denies: SOB, wheezing, cough, fevers  ROS: See pertinent positives and negatives per HPI.  Past Medical History  Diagnosis Date  . Depression   . Back pain   . Dislocated shoulder     Past Surgical History  Procedure Laterality Date  . Clavicle surgery    . Tonsillectomy      Family History  Problem Relation Age of Onset  . Mental illness Mother   . Cancer Maternal Grandmother   . Diabetes Maternal Grandfather     History   Social History  . Marital Status: Married    Spouse Name: N/A  . Number of Children: N/A  . Years of Education: N/A   Social History Main Topics  . Smoking status: Never Smoker   . Smokeless tobacco: Not on file  . Alcohol Use: No  . Drug Use: Not on file  . Sexual Activity: Not on file   Other Topics Concern  . 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  escitalopram (LEXAPRO) 10 MG tablet, Take 1 tablet (10 mg total) by mouth daily., Disp: 90 tablet, Rfl: 1 .  fluticasone (FLONASE) 50 MCG/ACT nasal spray, Place 2 sprays into both nostrils daily., Disp: 16 g, Rfl: 6 .  Linoleic Acid-Sunflower Oil (CLA PO), Take by mouth., Disp: , Rfl:  .  meloxicam (MOBIC) 15 MG tablet, 1 tablet po q day for 1-2 weeks for ankle pain/sprain-take with food, Disp: 30 tablet, Rfl: 0 .  Misc Natural Products (ENERGY SUPPORT PO), Take by mouth., Disp: , Rfl:  .  Multiple Vitamin (MULTIVITAMIN) capsule, Take 1 capsule by mouth daily., Disp: , Rfl:   EXAM:  Filed Vitals:   10/19/14 1603  BP: 118/80  Pulse: 60  Temp: 98.3 F (36.8 C)    Body mass index is 27.02 kg/(m^2).  GENERAL: vitals  reviewed and listed above, alert, oriented, appears well hydrated and in no acute distress  HEENT: atraumatic, conjunttiva clear, no obvious abnormalities on inspection of external nose and ears  NECK: no obvious masses on inspection  LUNGS: clear to auscultation bilaterally, no wheezes, rales or rhonchi, good air movement  CV: HRRR, no peripheral edema  MS: moves all extremities without noticeable abnormality  PSYCH: pleasant and cooperative, no obvious depression or anxiety  ASSESSMENT AND PLAN:  Discussed the following assessment and plan:  Rhinosinusitis  -much improved -Patient advised to return or notify a doctor immediately if symptoms worsen or persist or new concerns arise.  There are no Patient Instructions on file for this visit.   Kriste BasqueKIM, Maurice Ramseur R.

## 2017-03-08 ENCOUNTER — Encounter: Payer: Self-pay | Admitting: Family Medicine

## 2017-06-28 ENCOUNTER — Encounter: Payer: Self-pay | Admitting: Family Medicine

## 2020-01-15 ENCOUNTER — Encounter (HOSPITAL_COMMUNITY): Payer: Self-pay

## 2020-01-15 ENCOUNTER — Other Ambulatory Visit: Payer: Self-pay

## 2020-01-15 ENCOUNTER — Ambulatory Visit (HOSPITAL_COMMUNITY)
Admission: EM | Admit: 2020-01-15 | Discharge: 2020-01-15 | Disposition: A | Discharge status: Home / Self Care | Payer: No Typology Code available for payment source | Attending: Emergency Medicine | Admitting: Emergency Medicine

## 2020-01-15 ENCOUNTER — Ambulatory Visit (INDEPENDENT_AMBULATORY_CARE_PROVIDER_SITE_OTHER): Payer: No Typology Code available for payment source

## 2020-01-15 DIAGNOSIS — M25512 Pain in left shoulder: Secondary | ICD-10-CM

## 2020-01-15 MED ORDER — NAPROXEN 500 MG PO TABS: 500.0000 mg | ORAL_TABLET | Freq: Two times a day (BID) | ORAL | 0 refills | Status: DC

## 2020-01-15 NOTE — Discharge Instructions (Signed)
Xray normal, no dislocation or fracture, stable from previous xrays We will treat as a shoulder strain and monitor for gradual resolution of symptoms Take Naprosyn twice daily with food Gentle range of motion Ice Follow up with sports medicine if persisting

## 2020-01-15 NOTE — ED Triage Notes (Signed)
Pt presents with shoulder injury from trying to pull a seat down from a ruck at work today.

## 2020-01-16 NOTE — ED Provider Notes (Signed)
[facility]    CSN: [id number] Arrival date & time: 01/15/20  1810      History   Chief Complaint Chief Complaint  Patient presents with   Shoulder Injury    HPI John Campos is a 34 y.o. male presenting today for evaluation of shoulder injury.  Patient reports earlier today he was trying to pull down the seat in a truck while at work.  Felt a pulling sensation in his shoulder and since has had pain with certain movements.  He denies difficulty doing over the head movements and has full range of motion but feels the pain that is sharp deep within his joint.  Has had prior clavicle fracture requiring surgical repair as well as reports prior dislocations of shoulder.  Injury happened a few hours ago.  Denies paresthesias, but does occasionally have pain shooting to bicep of the left side.  HPI  Past Medical History:  Diagnosis Date   Back pain    Depression    Dislocated shoulder     There are no problems to display for this patient.   Past Surgical History:  Procedure Laterality Date   CLAVICLE SURGERY     TONSILLECTOMY         Home Medications    Prior to Admission medications   Medication Sig Start Date End Date Taking? Authorizing Provider  fluticasone (FLONASE) 50 MCG/ACT nasal spray Place 2 sprays into both nostrils daily. 09/18/14   Terressa Koyanagi, DO  Linoleic Acid-Sunflower Oil (CLA PO) Take by mouth.    [provider]  Misc Natural Products (ENERGY SUPPORT PO) Take by mouth.    [provider]  Multiple Vitamin (MULTIVITAMIN) capsule Take 1 capsule by mouth daily.    [provider]  naproxen (NAPROSYN) 500 MG tablet Take 1 tablet (500 mg total) by mouth 2 (two) times daily. 01/15/20   Jaidy Cottam C, PA-C  escitalopram (LEXAPRO) 10 MG tablet Take 1 tablet (10 mg total) by mouth daily. 09/18/14 01/15/20  Terressa Koyanagi, DO    Family History Family History  Problem Relation Age of Onset   Mental illness Mother      Cancer Maternal Grandmother    Diabetes Maternal Grandfather     Social History Social History   Tobacco Use   Smoking status: Never Smoker  Substance Use Topics   Alcohol use: No   Drug use: Not on file     Allergies   Codeine   Review of Systems Review of Systems  Constitutional: Negative for fatigue and fever.  Eyes: Negative for redness, itching and visual disturbance.  Respiratory: Negative for shortness of breath.   Cardiovascular: Negative for chest pain and leg swelling.  Gastrointestinal: Negative for nausea and vomiting.  Musculoskeletal: Positive for arthralgias and myalgias.  Skin: Negative for color change, rash and wound.  Neurological: Negative for dizziness, syncope, weakness, light-headedness and headaches.     Physical Exam Triage Vital Signs ED Triage Vitals  Enc Vitals Group     BP 01/15/20 1904 (!) 156/136     Pulse Rate 01/15/20 1904 71     Resp 01/15/20 1904 18     Temp 01/15/20 1904 98.4 F (36.9 C)     Temp src --      SpO2 01/15/20 1904 98 %     Weight --      Height --      Head Circumference --      Peak Flow --  Pain Score 01/15/20 1901 5     Pain Loc --      Pain Edu? --      Excl. in GC? --    No data found.  Updated Vital Signs BP (!) 156/136 (BP Location: Right Arm)    Pulse 71    Temp 98.4 F (36.9 C)    Resp 18    SpO2 98%   Visual Acuity Right Eye Distance:   Left Eye Distance:   Bilateral Distance:    Right Eye Near:   Left Eye Near:    Bilateral Near:     Physical Exam Vitals and nursing note reviewed.  Constitutional:      Appearance: He is well-developed.     Comments: No acute distress  HENT:     Head: Normocephalic and atraumatic.     Nose: Nose normal.  Eyes:     Conjunctiva/sclera: Conjunctivae normal.  Cardiovascular:     Rate and Rhythm: Normal rate.  Pulmonary:     Effort: Pulmonary effort is normal. No respiratory distress.  Abdominal:     General: There is no distension.   Musculoskeletal:        General: Normal range of motion.     Cervical back: Neck supple.     Comments: Left shoulder: No obvious deformity or discoloration, nontender to palpation along length of clavicle, AC joint or scapular spine, nontender to palpation around periscapular musculature or anterior chest, mild tenderness to palpation over anterior shoulder extending into bicep Full active range of motion of shoulder in all directions Grip strength 5/5 ankle bilaterally, radial pulse 2+  Skin:    General: Skin is warm and dry.  Neurological:     Mental Status: He is alert and oriented to person, place, and time.      UC Treatments / Results  Labs (all labs ordered are listed, but only abnormal results are displayed) Labs Reviewed - No data to display  EKG   Radiology DG Shoulder Left  Result Date: 01/15/2020 CLINICAL DATA:  Recent pulling injury with shoulder pain, initial encounter EXAM: LEFT SHOULDER - 2+ VIEW COMPARISON:  10/14/2013 FINDINGS: Changes of prior surgical repair of a left clavicular fracture are again noted. Humeral head is well seated. Bone island is noted within the glenoid. No fracture is seen. No soft tissue abnormality is noted. IMPRESSION: Postsurgical changes in the left clavicle. No acute abnormality noted. Electronically Signed   By: Alcide Clever M.D.   On: 01/15/2020 20:01    Procedures Procedures (including critical care time)  Medications Ordered in UC Medications - No data to display  Initial Impression / Assessment and Plan / UC Course  I have reviewed the triage vital signs and the nursing notes.  Pertinent labs & imaging results that were available during my care of the patient were reviewed by me and considered in my medical decision making (see chart for details).     X-ray negative for fracture, stable from prior imaging.  Do not suspect rotator cuff injury at this time and patient has full movement of right shoulder, will treat as sprain  and recommend anti-inflammatories with close monitoring.  Follow-up with sports medicine if symptoms persisting.  Discussed strict return precautions. Patient verbalized understanding and is agreeable with plan.  Final Clinical Impressions(s) / UC Diagnoses   Final diagnoses:  Acute pain of left shoulder     Discharge Instructions     Xray normal, no dislocation or fracture, stable from previous xrays We  will treat as a shoulder strain and monitor for gradual resolution of symptoms Take Naprosyn twice daily with food Gentle range of motion Ice Follow up with sports medicine if persisting   ED Prescriptions    Medication Sig Dispense Auth. Provider   naproxen (NAPROSYN) 500 MG tablet Take 1 tablet (500 mg total) by mouth 2 (two) times daily. 30 tablet Marien Manship, [place] C, PA-C     PDMP not reviewed this encounter.   Lew Dawes, [place] 01/16/20 1212

## 2020-09-12 ENCOUNTER — Emergency Department (HOSPITAL_BASED_OUTPATIENT_CLINIC_OR_DEPARTMENT_OTHER)
Admission: EM | Admit: 2020-09-12 | Discharge: 2020-09-12 | Disposition: A | Discharge status: Home / Self Care | Payer: Medicaid Other | Attending: Emergency Medicine | Admitting: Emergency Medicine

## 2020-09-12 ENCOUNTER — Other Ambulatory Visit: Payer: Self-pay

## 2020-09-12 DIAGNOSIS — E860 Dehydration: Secondary | ICD-10-CM | POA: Insufficient documentation

## 2020-09-12 DIAGNOSIS — R000 Tachycardia, unspecified: Secondary | ICD-10-CM | POA: Insufficient documentation

## 2020-09-12 DIAGNOSIS — R197 Diarrhea, unspecified: Secondary | ICD-10-CM | POA: Insufficient documentation

## 2020-09-12 LAB — COMPREHENSIVE METABOLIC PANEL
ALT: 65 U/L — ABNORMAL HIGH (ref 0–44)
AST: 33 U/L (ref 15–41)
Albumin: 4.9 g/dL (ref 3.5–5.0)
Alkaline Phosphatase: 55 U/L (ref 38–126)
Anion gap: 10 (ref 5–15)
BUN: 25 mg/dL — ABNORMAL HIGH (ref 6–20)
CO2: 17 mmol/L — ABNORMAL LOW (ref 22–32)
Calcium: 9.6 mg/dL (ref 8.9–10.3)
Chloride: 109 mmol/L (ref 98–111)
Creatinine, Ser: 1.1 mg/dL (ref 0.61–1.24)
GFR, Estimated: >60 mL/min (ref >60)
Glucose, Bld: 104 mg/dL — ABNORMAL HIGH (ref 70–99)
Potassium: 3.9 mmol/L (ref 3.5–5.1)
Sodium: 136 mmol/L (ref 135–145)
Total Bilirubin: 0.6 mg/dL (ref 0.3–1.2)
Total Protein: 7.3 g/dL (ref 6.5–8.1)

## 2020-09-12 LAB — URINALYSIS, ROUTINE W REFLEX MICROSCOPIC
Bilirubin Urine: NEGATIVE
Glucose, UA: NEGATIVE mg/dL
Hgb urine dipstick: NEGATIVE
Ketones, ur: NEGATIVE mg/dL
Leukocytes,Ua: NEGATIVE
Nitrite: NEGATIVE
Protein, ur: NEGATIVE mg/dL
Specific Gravity, Urine: 1.021 (ref 1.005–1.030)
pH: 5.5 (ref 5.0–8.0)

## 2020-09-12 LAB — CBC WITH DIFFERENTIAL/PLATELET
Abs Immature Granulocytes: 0.02 10*3/uL (ref 0.00–0.07)
Basophils Absolute: 0 10*3/uL (ref 0.0–0.1)
Basophils Relative: 0 %
Eosinophils Absolute: 0.1 10*3/uL (ref 0.0–0.5)
Eosinophils Relative: 1 %
HCT: 41.5 % (ref 39.0–52.0)
Hemoglobin: 14.2 g/dL (ref 13.0–17.0)
Immature Granulocytes: 0 %
Lymphocytes Relative: 3 %
Lymphs Abs: 0.3 10*3/uL — ABNORMAL LOW (ref 0.7–4.0)
MCH: 30.3 pg (ref 26.0–34.0)
MCHC: 34.2 g/dL (ref 30.0–36.0)
MCV: 88.5 fL (ref 80.0–100.0)
Monocytes Absolute: 0.7 10*3/uL (ref 0.1–1.0)
Monocytes Relative: 8 %
Neutro Abs: 8.2 10*3/uL — ABNORMAL HIGH (ref 1.7–7.7)
Neutrophils Relative %: 88 %
Platelets: 279 10*3/uL (ref 150–400)
RBC: 4.69 MIL/uL (ref 4.22–5.81)
RDW: 12.3 % (ref 11.5–15.5)
WBC: 9.3 10*3/uL (ref 4.0–10.5)
nRBC: 0 % (ref 0.0–0.2)

## 2020-09-12 MED ORDER — SODIUM CHLORIDE 0.9 % IV BOLUS
1000.0000 mL | Freq: Once | INTRAVENOUS | Status: AC
  Administered 2020-09-12: 1000 mL via INTRAVENOUS

## 2020-09-12 MED ORDER — ACETAMINOPHEN 325 MG PO TABS
650.0000 mg | ORAL_TABLET | Freq: Once | ORAL | Status: AC
  Administered 2020-09-12: 650 mg via ORAL
  Filled 2020-09-12: qty 2

## 2020-09-12 NOTE — ED Triage Notes (Signed)
Pt c/o generalized weakness. States has had diarrhea starting last night. (+) nausea; denies vomiting. (+) sob.

## 2020-09-12 NOTE — ED Notes (Signed)
MD at bedside. 

## 2020-09-12 NOTE — ED Provider Notes (Signed)
[facility] EMERGENCY DEPARTMENT Provider Note  CSN: [id number] Arrival date & time: 09/12/20 2426    History Chief Complaint  Patient presents with  . Weakness  . Diarrhea    HPI  John Campos is a 35 y.o. male with no significant PMH reports his family had a GI illness about a week or so ago. Last night he began to feel his stomach rolling and then began to have multiple episodes of water, non bloody diarrhea overnight. He did not have any vomiting. No reported fever but he felt shaky and was breathing fast. Has not urinated since last night. He was feeling weak, lightheaded and dehydrated this morning so he came to the ED for evaluation.    Past Medical History:  Diagnosis Date  . Back pain   . Depression   . Dislocated shoulder     Past Surgical History:  Procedure Laterality Date  . CLAVICLE SURGERY    . TONSILLECTOMY      Family History  Problem Relation Age of Onset  . Mental illness Mother   . Cancer Maternal Grandmother   . Diabetes Maternal Grandfather     Social History   Tobacco Use  . Smoking status: Never Smoker  Substance Use Topics  . Alcohol use: No     Home Medications Prior to Admission medications   Medication Sig Start Date End Date Taking? Authorizing Provider  fluticasone (FLONASE) 50 MCG/ACT nasal spray Place 2 sprays into both nostrils daily. 09/18/14   Terressa Koyanagi, DO  Linoleic Acid-Sunflower Oil (CLA PO) Take by mouth.    [provider]  Misc Natural Products (ENERGY SUPPORT PO) Take by mouth.    [provider]  Multiple Vitamin (MULTIVITAMIN) capsule Take 1 capsule by mouth daily.    [provider]  naproxen (NAPROSYN) 500 MG tablet Take 1 tablet (500 mg total) by mouth 2 (two) times daily. 01/15/20   Wieters, Hallie C, PA-C  escitalopram (LEXAPRO) 10 MG tablet Take 1 tablet (10 mg total) by mouth daily. 09/18/14 01/15/20  Terressa Koyanagi, DO     Allergies    Codeine   Review of Systems    Review of Systems A comprehensive review of systems was completed and negative except as noted in HPI.    Physical Exam BP 106/70 (BP Location: Left Arm)   Pulse 74   Temp 98.6 F (37 C) (Oral)   Resp 16   Ht 6\' 1"  (1.854 m)   Wt 108.9 kg   SpO2 100%   BMI 31.66 kg/m   Physical Exam Vitals and nursing note reviewed.  Constitutional:      Appearance: Normal appearance.  HENT:     Head: Normocephalic and atraumatic.     Nose: Nose normal.     Mouth/Throat:     Mouth: Mucous membranes are dry.  Eyes:     Extraocular Movements: Extraocular movements intact.     Conjunctiva/sclera: Conjunctivae normal.  Cardiovascular:     Rate and Rhythm: Tachycardia present.  Pulmonary:     Effort: Pulmonary effort is normal.     Breath sounds: Normal breath sounds.  Abdominal:     General: Abdomen is flat.     Palpations: Abdomen is soft.     Tenderness: There is no abdominal tenderness.  Musculoskeletal:        General: No swelling. Normal range of motion.     Cervical back: Neck supple.  Skin:    General: Skin is warm and  dry.  Neurological:     General: No focal deficit present.     Mental Status: He is alert.  Psychiatric:        Mood and Affect: Mood normal.      ED Results / Procedures / Treatments   Labs (all labs ordered are listed, but only abnormal results are displayed) Labs Reviewed  COMPREHENSIVE METABOLIC PANEL - Abnormal; Notable for the following components:      Result Value   CO2 17 (*)    Glucose, Bld 104 (*)    BUN 25 (*)    ALT 65 (*)    All other components within normal limits  CBC WITH DIFFERENTIAL/PLATELET - Abnormal; Notable for the following components:   Neutro Abs 8.2 (*)    Lymphs Abs 0.3 (*)    All other components within normal limits  URINALYSIS, ROUTINE W REFLEX MICROSCOPIC    EKG None   Radiology No results found.  Procedures Procedures  Medications Ordered in the ED Medications  sodium chloride 0.9 % bolus 1,000 mL (0  mLs Intravenous Stopped 09/12/20 1055)  acetaminophen (TYLENOL) tablet 650 mg (650 mg Oral Given 09/12/20 1104)  sodium chloride 0.9 % bolus 1,000 mL (0 mLs Intravenous Stopped 09/12/20 1241)     MDM Rules/Calculators/A&P MDM Patient appears clinically dehydrated. Abdomen is benign. Will check basic labs and give IVF.   ED Course  I have reviewed the triage vital signs and the nursing notes.  Pertinent labs & imaging results that were available during my care of the patient were reviewed by me and considered in my medical decision making (see chart for details).  Clinical Course as of 09/12/20 1538  Sun Sep 12, 2020  1006 CBC is normal.  [CS]  1028 CMP with mild acidosis, consistent with volume loss, otherwise no concerning findings.  [CS]  1151 UA clear, patient is feeling better. Will give a second liter of IVF and PO trial.  [CS]    Clinical Course User Index [CS] Pollyann Savoy, MD    Final Clinical Impression(s) / ED Diagnoses Final diagnoses:  Diarrhea of presumed infectious origin  Dehydration    Rx / DC Orders ED Discharge Orders    None       Pollyann Savoy, MD 09/12/20 1538

## 2020-09-12 NOTE — ED Notes (Signed)
Patient reports he is feeling better than he did last night--states mild dizziness when going from laying flat to sitting up. Ambulated to restroom--no assistance needed. No diarrhea.

## 2021-11-11 ENCOUNTER — Emergency Department (HOSPITAL_BASED_OUTPATIENT_CLINIC_OR_DEPARTMENT_OTHER)
Admission: EM | Admit: 2021-11-11 | Discharge: 2021-11-11 | Disposition: A | Discharge status: Home / Self Care | Payer: Medicaid Other | Attending: Emergency Medicine | Admitting: Emergency Medicine

## 2021-11-11 ENCOUNTER — Other Ambulatory Visit: Payer: Self-pay

## 2021-11-11 ENCOUNTER — Encounter (HOSPITAL_BASED_OUTPATIENT_CLINIC_OR_DEPARTMENT_OTHER): Payer: Self-pay

## 2021-11-11 DIAGNOSIS — R21 Rash and other nonspecific skin eruption: Secondary | ICD-10-CM | POA: Diagnosis present

## 2021-11-11 DIAGNOSIS — B084 Enteroviral vesicular stomatitis with exanthem: Secondary | ICD-10-CM | POA: Diagnosis not present

## 2021-11-11 MED ORDER — IBUPROFEN 800 MG PO TABS
800.0000 mg | ORAL_TABLET | Freq: Once | ORAL | Status: AC
  Administered 2021-11-11: 800 mg via ORAL
  Filled 2021-11-11: qty 1

## 2021-11-11 NOTE — ED Triage Notes (Signed)
Reports bilateral upper and lower extremity pain beginning abruptly 48 hours ago. Describes pain as pins and needles. Unable to make a fist.   Also has new rash around hands face and feels they may be in throat.

## 2021-11-11 NOTE — ED Provider Notes (Signed)
[facility] EMERGENCY DEPT  Provider Note  CSN: [id number] Arrival date & time: 11/11/21 4163  History Chief Complaint  Patient presents with   Rash   Extremity Pain    John Campos is a 36 y.o. male with 2 days of painful rash on his hands, face and throat. No fever. Child recently had similar symptoms, diagnosed as possible strep via a telehealth visit. He has not had any other known exposures. He is otherwise healthy. Immunizations are UTD.    Home Medications Prior to Admission medications   Medication Sig Start Date End Date Taking? Authorizing Provider  fluticasone (FLONASE) 50 MCG/ACT nasal spray Place 2 sprays into both nostrils daily. 09/18/14   Terressa Koyanagi, DO  Linoleic Acid-Sunflower Oil (CLA PO) Take by mouth.    [provider]  Misc Natural Products (ENERGY SUPPORT PO) Take by mouth.    [provider]  Multiple Vitamin (MULTIVITAMIN) capsule Take 1 capsule by mouth daily.    [provider]  naproxen (NAPROSYN) 500 MG tablet Take 1 tablet (500 mg total) by mouth 2 (two) times daily. 01/15/20   Wieters, Hallie C, PA-C  escitalopram (LEXAPRO) 10 MG tablet Take 1 tablet (10 mg total) by mouth daily. 09/18/14 01/15/20  Terressa Koyanagi, DO     Allergies    Codeine   Review of Systems   Review of Systems Please see HPI for pertinent positives and negatives  Physical Exam BP (!) 140/102   Pulse 86   Temp 98.2 F (36.8 C) (Oral)   Resp 18   Ht 6' (1.829 m)   Wt 104.3 kg   SpO2 98%   BMI 31.19 kg/m   Physical Exam Vitals and nursing note reviewed.  Constitutional:      Appearance: Normal appearance.  HENT:     Head: Normocephalic and atraumatic.     Nose: Nose normal.     Mouth/Throat:     Mouth: Mucous membranes are moist.     Comments: Occasional well demarcated erythematous lesions on soft palate and buccal surfaces Eyes:     Extraocular Movements: Extraocular movements intact.     Conjunctiva/sclera:  Conjunctivae normal.  Cardiovascular:     Rate and Rhythm: Normal rate.  Pulmonary:     Effort: Pulmonary effort is normal.     Breath sounds: Normal breath sounds.  Abdominal:     General: Abdomen is flat.     Palpations: Abdomen is soft.     Tenderness: There is no abdominal tenderness.  Musculoskeletal:        General: No swelling. Normal range of motion.     Cervical back: Neck supple.  Skin:    General: Skin is warm and dry.     Findings: Rash (erythematous papular rash on palms of hands and face) present.  Neurological:     General: No focal deficit present.     Mental Status: He is alert and oriented to person, place, and time.  Psychiatric:        Mood and Affect: Mood normal.         ED Results / Procedures / Treatments   EKG None  Procedures Procedures  Medications Ordered in the ED Medications  ibuprofen (ADVIL) tablet 800 mg (has no administration in time range)    Initial Impression and Plan  Patient with a rash consistent with Hand, foot and mouth disease. No other concerning findings. Patient is well appearing and afebrile. Advised supportive care is all that is  required. Recommend Motrin/APAP for pain, maintain hydration and avoid contact with others. PCP follow up.   ED Course       MDM Rules/Calculators/A&P Medical Decision Making Problems Addressed: Hand, foot and mouth disease: acute illness or injury  Risk OTC drugs.    Final Clinical Impression(s) / ED Diagnoses Final diagnoses:  Hand, foot and mouth disease    Rx / DC Orders ED Discharge Orders     None        Pollyann Savoy, MD 11/11/21 [phone]

## 2021-11-26 IMAGING — DX DG SHOULDER 2+V*L*
3 series · 3 of 3 positions shown · non-contrast
Comparison: 10/14/2013

CLINICAL DATA: Recent pulling injury with shoulder pain, initial
encounter

EXAM:
LEFT SHOULDER - 2+ VIEW

[shoulder ap]
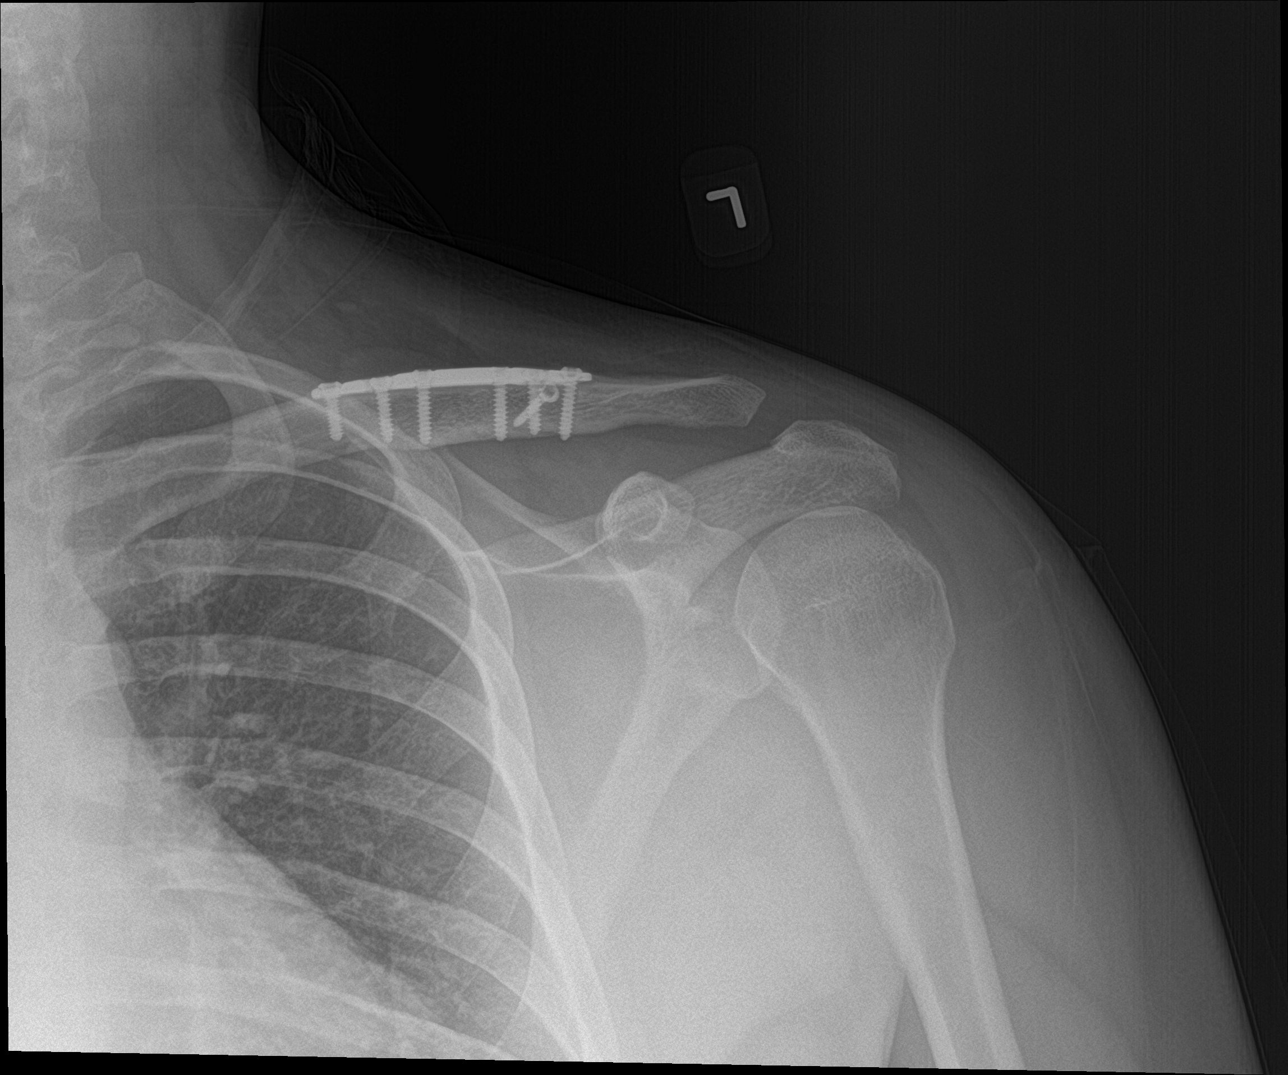

[shoulder grashey]
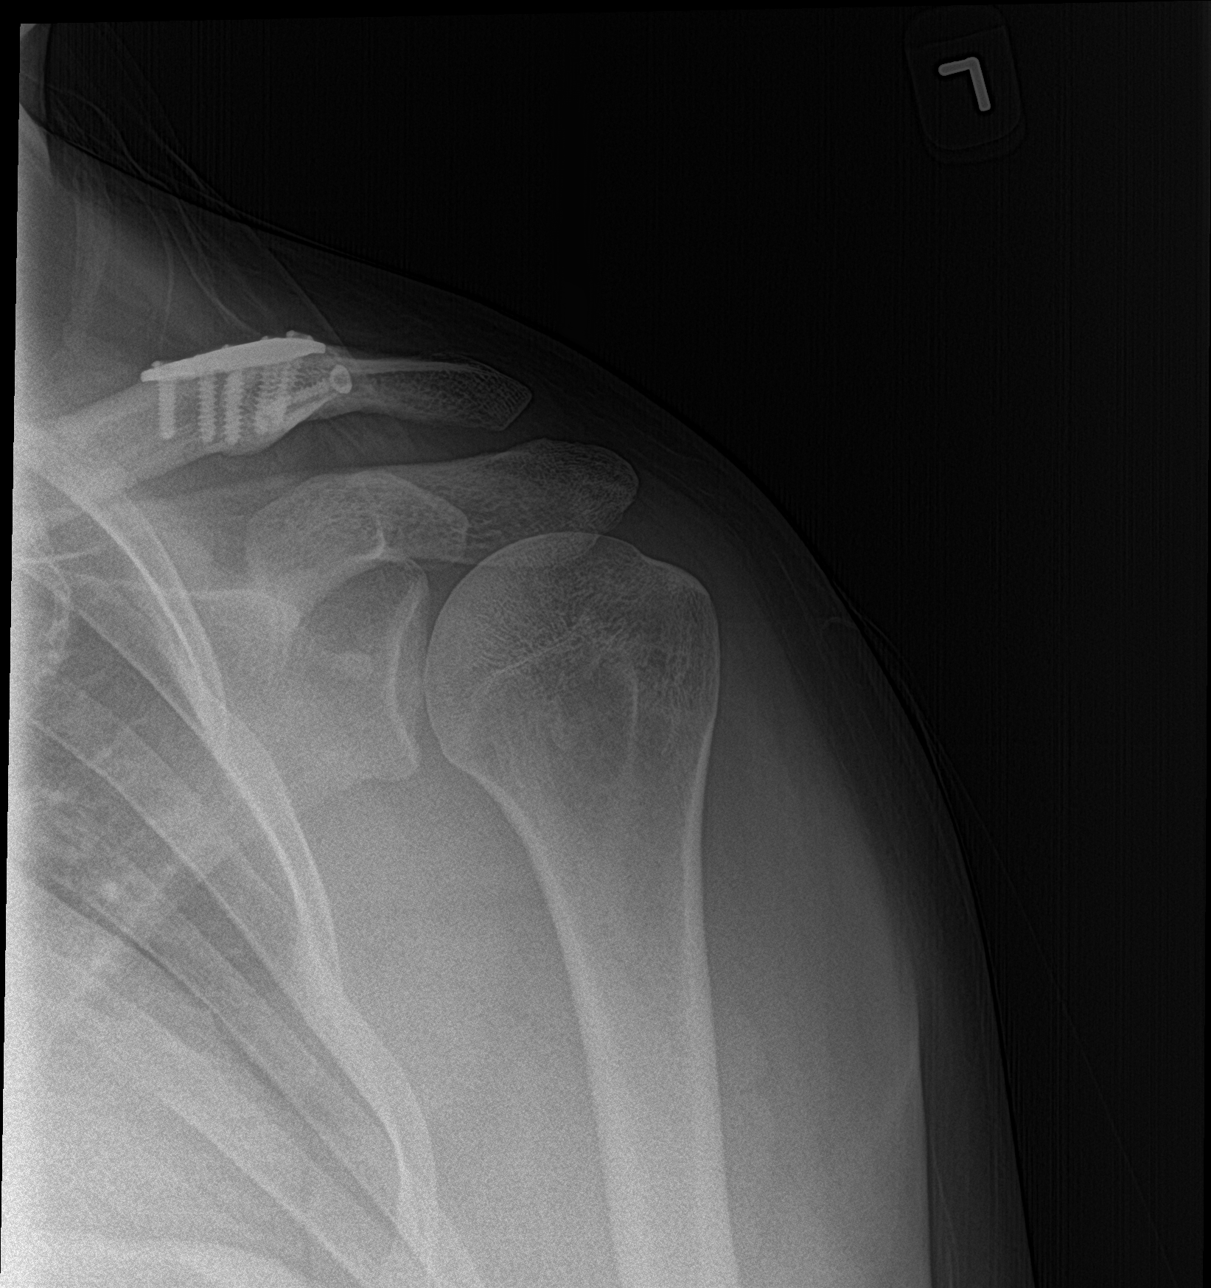

[shoulder y-view]
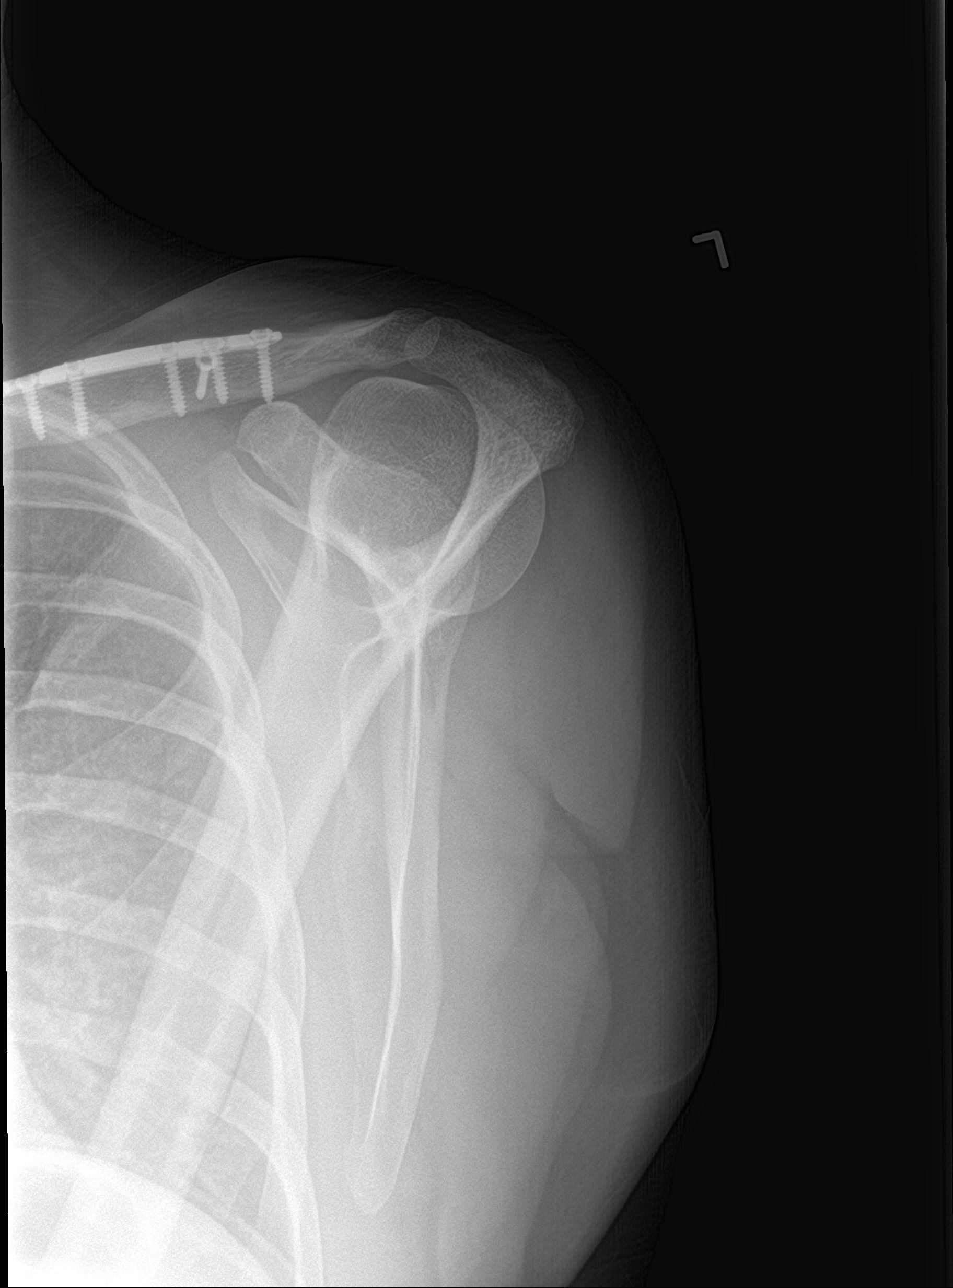

[3 of 3 positions shown; findings below may reference images not displayed]

FINDINGS: Changes of prior surgical repair of a left clavicular fracture are
again noted. Humeral head is well seated. Bone island is noted
within the glenoid. No fracture is seen. No soft tissue abnormality
is noted.
IMPRESSION: Postsurgical changes in the left clavicle. No acute abnormality
noted.

## 2022-12-01 ENCOUNTER — Encounter (HOSPITAL_COMMUNITY): Payer: Self-pay

## 2022-12-01 ENCOUNTER — Emergency Department (HOSPITAL_COMMUNITY): Payer: No Typology Code available for payment source

## 2022-12-01 ENCOUNTER — Other Ambulatory Visit: Payer: Self-pay

## 2022-12-01 ENCOUNTER — Emergency Department (HOSPITAL_COMMUNITY)
Admission: EM | Admit: 2022-12-01 | Discharge: 2022-12-01 | Disposition: A | Discharge status: Home / Self Care | Payer: No Typology Code available for payment source | Attending: Emergency Medicine | Admitting: Emergency Medicine

## 2022-12-01 DIAGNOSIS — R079 Chest pain, unspecified: Secondary | ICD-10-CM | POA: Diagnosis present

## 2022-12-01 DIAGNOSIS — R002 Palpitations: Secondary | ICD-10-CM | POA: Insufficient documentation

## 2022-12-01 LAB — BASIC METABOLIC PANEL
Anion gap: 10 (ref 5–15)
BUN: 20 mg/dL (ref 6–20)
CO2: 21 mmol/L — ABNORMAL LOW (ref 22–32)
Calcium: 9.2 mg/dL (ref 8.9–10.3)
Chloride: 107 mmol/L (ref 98–111)
Creatinine, Ser: 1.07 mg/dL (ref 0.61–1.24)
GFR, Estimated: >60 mL/min (ref >60)
Glucose, Bld: 104 mg/dL — ABNORMAL HIGH (ref 70–99)
Potassium: 3.3 mmol/L — ABNORMAL LOW (ref 3.5–5.1)
Sodium: 138 mmol/L (ref 135–145)

## 2022-12-01 LAB — CBC
HCT: 37.6 % — ABNORMAL LOW (ref 39.0–52.0)
Hemoglobin: 12.9 g/dL — ABNORMAL LOW (ref 13.0–17.0)
MCH: 30.1 pg (ref 26.0–34.0)
MCHC: 34.3 g/dL (ref 30.0–36.0)
MCV: 87.9 fL (ref 80.0–100.0)
Platelets: 365 10*3/uL (ref 150–400)
RBC: 4.28 MIL/uL (ref 4.22–5.81)
RDW: 13.2 % (ref 11.5–15.5)
WBC: 7 10*3/uL (ref 4.0–10.5)
nRBC: 0 % (ref 0.0–0.2)

## 2022-12-01 LAB — D-DIMER, QUANTITATIVE: D-Dimer, Quant: <0.27 ug/mL-FEU (ref 0.00–0.50)

## 2022-12-01 LAB — TROPONIN I (HIGH SENSITIVITY)
Troponin I (High Sensitivity): 4 ng/L (ref <18)
Troponin I (High Sensitivity): 2 ng/L (ref <18)

## 2022-12-01 MED ORDER — KETOROLAC TROMETHAMINE 15 MG/ML IJ SOLN
15.0000 mg | Freq: Once | INTRAMUSCULAR | Status: AC
  Administered 2022-12-01: 15 mg via INTRAVENOUS
  Filled 2022-12-01: qty 1

## 2022-12-01 MED ORDER — HYDROXYZINE HCL 25 MG PO TABS
25.0000 mg | ORAL_TABLET | Freq: Once | ORAL | Status: AC
  Administered 2022-12-01: 25 mg via ORAL
  Filled 2022-12-01: qty 1

## 2022-12-01 NOTE — ED Triage Notes (Addendum)
Pt arrived from work via [employer] c/o chest pain that has been intermittent for a few days. Worse tonight than ever, Pt started feeling flutter in chest then became dizzy with the chest pain. BP upon EMS arrival 180/110

## 2022-12-01 NOTE — ED Provider Notes (Signed)
[facility] EMERGENCY DEPARTMENT AT [facility] Provider Note   CSN: [id number] Arrival date & time: 12/01/22  0331     History  Chief Complaint  Patient presents with   Chest Pain    John Campos is a 37 y.o. male.   Chest Pain   37 year old male presents emergency department with complaints of chest pain described as tightness/pressure as well as feelings of palpitations.  States that he has some radiation to his left neck.  Patient states that symptoms present for the past 3 to 4 days intermittent in nature.  States that symptoms have kept him up at night.  Patient states that he has baseline anxiety which seems to be more noticeable in the recent months.  States his symptoms are not necessarily worsened with physical exertion and seem to be random in nature occurring when he lays down at night, sitting at work, walking... Denies any fever, cough, abdominal pain, nausea, vomiting, urinary symptoms, change in bowel habits.  Denies history of similar symptoms in the past.  Denies cigarette use or illicit substance use.  Denies history of DVT/PE, recent surgery/immobilization, known malignancy, known coagulopathy, hormonal use.  Past medical history significant for anxiety, depression  Home Medications Prior to Admission medications   Medication Sig Start Date End Date Taking? Authorizing Provider  fluticasone (FLONASE) 50 MCG/ACT nasal spray Place 2 sprays into both nostrils daily. 09/18/14   Terressa Koyanagi, DO  Linoleic Acid-Sunflower Oil (CLA PO) Take by mouth.    [provider]  Misc Natural Products (ENERGY SUPPORT PO) Take by mouth.    [provider]  Multiple Vitamin (MULTIVITAMIN) capsule Take 1 capsule by mouth daily.    [provider]  naproxen (NAPROSYN) 500 MG tablet Take 1 tablet (500 mg total) by mouth 2 (two) times daily. 01/15/20   Wieters, Hallie C, PA-C  escitalopram (LEXAPRO) 10 MG tablet Take 1 tablet (10 mg total) by mouth  daily. 09/18/14 01/15/20  Terressa Koyanagi, DO      Allergies    Codeine    Review of Systems   Review of Systems  Cardiovascular:  Positive for chest pain.  All other systems reviewed and are negative.   Physical Exam Updated Vital Signs BP 124/85   Pulse (!) 58   Temp 98.3 F (36.8 C) (Oral)   Resp 12   Ht 6\' 1"  (1.854 m)   Wt 108.9 kg   SpO2 99%   BMI 31.66 kg/m  Physical Exam Vitals and nursing note reviewed.  Constitutional:      General: He is not in acute distress.    Appearance: He is well-developed.  HENT:     Head: Normocephalic and atraumatic.  Eyes:     Conjunctiva/sclera: Conjunctivae normal.  Cardiovascular:     Rate and Rhythm: Normal rate and regular rhythm.     Pulses: Normal pulses.     Heart sounds: No murmur heard. Pulmonary:     Effort: Pulmonary effort is normal. No respiratory distress.     Breath sounds: Normal breath sounds. No wheezing, rhonchi or rales.  Chest:     Chest wall: No tenderness.  Abdominal:     General: There is no distension.     Palpations: Abdomen is soft.     Tenderness: There is no abdominal tenderness.  Musculoskeletal:        General: No swelling.     Cervical back: Neck supple.     Right lower leg: No edema.  Left lower leg: No edema.  Skin:    General: Skin is warm and dry.     Capillary Refill: Capillary refill takes less than 2 seconds.  Neurological:     Mental Status: He is alert.  Psychiatric:        Mood and Affect: Mood normal.     ED Results / Procedures / Treatments   Labs (all labs ordered are listed, but only abnormal results are displayed) Labs Reviewed  BASIC METABOLIC PANEL - Abnormal; Notable for the following components:      Result Value   Potassium 3.3 (*)    CO2 21 (*)    Glucose, Bld 104 (*)    All other components within normal limits  CBC - Abnormal; Notable for the following components:   Hemoglobin 12.9 (*)    HCT 37.6 (*)    All other components within normal limits   D-DIMER, QUANTITATIVE  TROPONIN I (HIGH SENSITIVITY)  TROPONIN I (HIGH SENSITIVITY)    EKG EKG Interpretation  Date/Time:  Friday December 01 2022 03:38:18 EDT Ventricular Rate:  68 PR Interval:  174 QRS Duration: 100 QT Interval:  394 QTC Calculation: 418 R Axis:   50 Text Interpretation: Normal sinus rhythm Normal ECG No previous ECGs available Confirmed by Vivien Rossetti ([id number]) on 12/01/2022 6:24:39 AM  Radiology DG Chest 2 View  Result Date: 12/01/2022 CLINICAL DATA:  37 year old male with chest pain, hypertensive, lightheadedness. EXAM: CHEST - 2 VIEW COMPARISON:  CT Chest, Abdomen, and Pelvis 10/14/2013. FINDINGS: PA and lateral views at 0353 hours. Lung volumes and mediastinal contours are normal. Visualized tracheal air column is within normal limits. Both lungs appear clear. No pneumothorax or pleural effusion. Chronic left clavicle ORIF. No acute osseous abnormality identified. Negative visible bowel gas. IMPRESSION: Negative.  No acute cardiopulmonary abnormality. Electronically Signed   By: Odessa Fleming M.D.   On: 12/01/2022 04:19    Procedures Procedures    Medications Ordered in ED Medications  ketorolac (TORADOL) 15 MG/ML injection 15 mg (15 mg Intravenous Given 12/01/22 0530)  hydrOXYzine (ATARAX) tablet 25 mg (25 mg Oral Given 12/01/22 0531)    ED Course/ Medical Decision Making/ A&P Clinical Course as of 12/01/22 6045  Fri Dec 01, 2022  4098 Shared decision-making conversation was had with patient regarding obtaining CT imaging of the chest given chest pain with elevated blood pressure reported by EMS in the 180s systolic for dissection rule out.  Patient declined. [CR]    Clinical Course User Index [CR] Peter Garter, PA                             Medical Decision Making Amount and/or Complexity of Data Reviewed Labs: ordered. Radiology: ordered.  Risk Prescription drug management.   This patient presents to the ED for concern of chest pain,  shortness of breath, this involves an extensive number of treatment options, and is a complaint that carries with it a high risk of complications and morbidity.  The differential diagnosis includes The causes for shortness of breath include but are not limited to Cardiac (AHF, pericardial effusion and tamponade, arrhythmias, ischemia, etc), aortic dissection Respiratory (COPD, asthma, pneumonia, pneumothorax, primary pulmonary hypertension, PE/VQ mismatch) Hematological (anemia)  Co morbidities that complicate the patient evaluation  See HPI   Additional history obtained:  Additional history obtained from EMR External records from outside source obtained and reviewed including hospital records   Lab Tests:  I Ordered,  and personally interpreted labs.  The pertinent results include: Mild hypocalcium Mia as well as decrease in bicarb of 3.3 and 21 respectively but otherwise, without electrolyte abnormalities.  No renal dysfunction.  D-dimer negative.  No leukocytosis.  Mild evidence of anemia with hemoglobin 12.9.  Platelets within range.  Initial troponin of 4 with repeat 2   Imaging Studies ordered:  I ordered imaging studies including chest x-ray I independently visualized and interpreted imaging which showed no acute cardiopulmonary abnormalities I agree with the radiologist interpretation   Cardiac Monitoring: / EKG:  The patient was maintained on a cardiac monitor.  I personally viewed and interpreted the cardiac monitored which showed an underlying rhythm of: Normal sinus rhythm   Consultations Obtained:  N/a   Problem List / ED Course / Critical interventions / Medication management  Chest pain I ordered medication including Atarax, Toradol   Reevaluation of the patient after these medicines showed that the patient improved I have reviewed the patients home medicines and have made adjustments as needed   Social Determinants of Health:  Denies tobacco, illicit  drug use   Test / Admission - Considered:  Chest pain Vitals signs within normal range and stable throughout visit. Laboratory/imaging studies significant for: See above 37 year old male presents emergency department with complaints of chest pain and symptoms of shortness of breath.  Patient's workup today overall reassuring.  Chest x-ray without signs of consolidation, pneumonia, infiltrate.  Patient without evidence of volume overload status, pulmonary vascular just on chest x-ray, low suspicion for CHF.  Patient with delta negative troponin, lack of acute ischemic changes on EKG.  Patient with heart score of 0-3 so doubt ACS.  Patient with negative D-dimer with Wells PE score 0 and PERC negative so doubt PE.  Patient's pain not radiating to back without pulse deficits, neurologic deficits, low suspicion for neck dissection.  No obvious wheeze/rhonchi auscultated in lung fields with no history of smoking so low suspicion for asthma/COPD exacerbation.  Unsure of exact etiology of patient's chest pain but symptoms do not seem emergent at this time.  Will recommend close follow-up with primary care/cardiology in outpatient setting.  Treatment plan discussed at length with patient and he acknowledged understanding was agreeable to said plan.  Patient overall well-appearing, afebrile in no acute distress. Worrisome signs and symptoms were discussed with the patient, and the patient acknowledged understanding to return to the ED if noticed. Patient was stable upon discharge.          Final Clinical Impression(s) / ED Diagnoses Final diagnoses:  Chest pain, unspecified type    Rx / DC Orders ED Discharge Orders     None         Peter Garter, [place] 12/01/22 9811    Mardene Sayer, MD 12/01/22 1708

## 2022-12-01 NOTE — Discharge Instructions (Signed)
As discussed, workup today overall reassuring.  There is no other you are having a heart attack.  No evidence of blood clot in the lung.  No evidence of pneumonia or other abnormality.  Recommend follow-up with cardiology in outpatient setting for reevaluation of your symptoms.  Attached is information to call to set up an appointment.  Please do not hesitate to return to emergency department for worrisome signs and symptoms we discussed become apparent.

## 2022-12-11 ENCOUNTER — Ambulatory Visit: Payer: No Typology Code available for payment source | Attending: Internal Medicine | Admitting: Internal Medicine

## 2022-12-11 ENCOUNTER — Encounter: Payer: Self-pay | Admitting: Internal Medicine

## 2022-12-11 VITALS — BP 124/80 | HR 76 | Ht 73.0 in | Wt 233.8 lb

## 2022-12-11 DIAGNOSIS — R002 Palpitations: Secondary | ICD-10-CM | POA: Diagnosis not present

## 2022-12-11 NOTE — Patient Instructions (Signed)
Medication Instructions:  NO CHANGES  *If you need a refill on your cardiac medications before your next appointment, please call your pharmacy*    Follow-Up: At [facility], you and your health needs are our priority.  As part of our continuing mission to provide you with exceptional heart care, we have created designated Provider Care Teams.  These Care Teams include your primary Cardiologist (physician) and Advanced Practice Providers (APPs -  Physician Assistants and Nurse Practitioners) who all work together to provide you with the care you need, when you need it.  We recommend signing up for the patient portal called "MyChart".  Sign up information is provided on this After Visit Summary.  MyChart is used to connect with patients for Virtual Visits (Telemedicine).  Patients are able to view lab/test results, encounter notes, upcoming appointments, etc.  Non-urgent messages can be sent to your provider as well.   To learn more about what you can do with MyChart, go to [email or web address].    Your next appointment:    AS NEEDED with Dr. Mary Branch 

## 2022-12-11 NOTE — Progress Notes (Unsigned)
Cardiology Office Note:    Date:  12/11/2022   ID:  John Campos, DOB Aug 04, 1985, MRN [id number]  PCP:  Soundra Pilon, FNP   [facility] Providers Cardiologist:  None { Click to update primary MD,subspecialty MD or APP then REFRESH:1}    Referring MD: Soundra Pilon, FNP   No chief complaint on file. CP  History of Present Illness:    John Campos is a 37 y.o. male with referral for palpitaitons/CP SOB in the setting of stress.  No DM2. No family hx of CAD.  No smoking cigarettes.  He walks for work. He can note heart pounding with walking which is uncomfortable and that is the pain. He can also feel discomfort in the abdomen and tingling in his hands.  Past Medical History:  Diagnosis Date   Back pain    Depression    Dislocated shoulder     Past Surgical History:  Procedure Laterality Date   CLAVICLE SURGERY     TONSILLECTOMY      Current Medications: Current Meds  Medication Sig   metoprolol succinate (TOPROL-XL) 25 MG 24 hr tablet Take 25 mg by mouth daily.   sertraline (ZOLOFT) 50 MG tablet Take 50 mg by mouth daily.     Allergies:   Codeine   Social History   Socioeconomic History   Marital status: Married    Spouse name: Not on file   Number of children: Not on file   Years of education: Not on file   Highest education level: Not on file  Occupational History   Not on file  Tobacco Use   Smoking status: Never   Smokeless tobacco: Not on file  Substance and Sexual Activity   Alcohol use: No   Drug use: Not on file   Sexual activity: Not on file  Other Topics Concern   Not on file  Social History Narrative   Work or School: Designer, jewellery - product selection      Home Situation: lives with wife      Spiritual Beliefs: Christian      Lifestyle: very active at work, mountain biking; diet healthy            Social Determinants of Health   Financial Resource Strain: Not on file  Food Insecurity: Not on file  Transportation  Needs: Not on file  Physical Activity: Not on file  Stress: Not on file  Social Connections: Not on file     Family History: The patient's family history includes Cancer in his maternal grandmother; Diabetes in his maternal grandfather; Mental illness in his mother.  ROS:   Please see the history of present illness.     All other systems reviewed and are negative.  EKGs/Labs/Other Studies Reviewed:    The following studies were reviewed today:    Recent Labs: 12/01/2022: BUN 20; Creatinine, Ser 1.07; Hemoglobin 12.9; Platelets 365; Potassium 3.3; Sodium 138  Recent Lipid Panel    Component Value Date/Time   CHOL 144 04/30/2014 0943   TRIG 39.0 04/30/2014 0943   HDL 66.20 04/30/2014 0943   CHOLHDL 2 04/30/2014 0943   VLDL 7.8 04/30/2014 0943   LDLCALC 70 04/30/2014 0943     Risk Assessment/Calculations:     Physical Exam:    VS:  BP 124/80   Pulse 76   Ht 6\' 1"  (1.854 m)   Wt 233 lb 12.8 oz (106.1 kg)   SpO2 98%   BMI 30.85 kg/m  Wt Readings from Last 3 Encounters:  12/11/22 233 lb 12.8 oz (106.1 kg)  12/01/22 240 lb (108.9 kg)  11/11/21 230 lb (104.3 kg)     GEN:  Well nourished, well developed in no acute distress HEENT: Normal NECK: No JVD; No carotid bruits CARDIAC: RRR, no murmurs, rubs, gallops RESPIRATORY:  Clear to auscultation without rales, wheezing or rhonchi  ABDOMEN: Soft, non-tender, non-distended MUSCULOSKELETAL:  No edema; No deformity  SKIN: Warm and dry NEUROLOGIC:  Alert and oriented x 3 PSYCHIATRIC:  Normal affect   ASSESSMENT:    Palpitations - he normal ED w/u and discussed with PCP this is likely anxiety related. I agree, no red flags for cardiac dx PLAN:    In order of problems listed above:  Follow up PRN           Medication Adjustments/Labs and Tests Ordered: Current medicines are reviewed at length with the patient today.  Concerns regarding medicines are outlined above.  No orders of the defined types were  placed in this encounter.  No orders of the defined types were placed in this encounter.   Patient Instructions  Medication Instructions:  NO CHANGES  *If you need a refill on your cardiac medications before your next appointment, please call your pharmacy*  Follow-Up: At [facility], you and your health needs are our priority.  As part of our continuing mission to provide you with exceptional heart care, we have created designated Provider Care Teams.  These Care Teams include your primary Cardiologist (physician) and Advanced Practice Providers (APPs -  Physician Assistants and Nurse Practitioners) who all work together to provide you with the care you need, when you need it.  We recommend signing up for the patient portal called "MyChart".  Sign up information is provided on this After Visit Summary.  MyChart is used to connect with patients for Virtual Visits (Telemedicine).  Patients are able to view lab/test results, encounter notes, upcoming appointments, etc.  Non-urgent messages can be sent to your provider as well.   To learn more about what you can do with MyChart, go to [email or web address].    Your next appointment:   AS NEEDED with Dr. Carolan Clines     Signed, Maisie Fus, MD  12/11/2022 3:14 PM    [facility]
